# Patient Record
Sex: Male | Born: 1945 | Race: White | Hispanic: No | Marital: Married | Smoking: Former smoker
Health system: Southern US, Community
[De-identification: ages and names within clinical notes are randomized; demographics above are authoritative.]

## PROBLEM LIST (undated history)

## (undated) DIAGNOSIS — I10 Essential (primary) hypertension: Secondary | ICD-10-CM

## (undated) DIAGNOSIS — E785 Hyperlipidemia, unspecified: Secondary | ICD-10-CM

---

## 2003-02-17 ENCOUNTER — Encounter: Payer: Self-pay | Admitting: Emergency Medicine

## 2003-02-17 ENCOUNTER — Emergency Department (HOSPITAL_COMMUNITY): Admission: EM | Admit: 2003-02-17 | Discharge: 2003-02-17 | Payer: Self-pay | Admitting: Emergency Medicine

## 2014-11-08 ENCOUNTER — Other Ambulatory Visit: Payer: Self-pay | Admitting: Cardiology

## 2014-11-08 DIAGNOSIS — I714 Abdominal aortic aneurysm, without rupture, unspecified: Secondary | ICD-10-CM

## 2014-11-13 ENCOUNTER — Ambulatory Visit
Admission: RE | Admit: 2014-11-13 | Discharge: 2014-11-13 | Disposition: A | Discharge status: Home / Self Care | Payer: PRIVATE HEALTH INSURANCE | Source: Ambulatory Visit | Attending: Cardiology | Admitting: Cardiology

## 2014-11-13 ENCOUNTER — Other Ambulatory Visit: Payer: Self-pay | Admitting: Cardiology

## 2014-11-13 DIAGNOSIS — I714 Abdominal aortic aneurysm, without rupture, unspecified: Secondary | ICD-10-CM

## 2016-07-07 IMAGING — US US AORTA
1 series · 13 of 13 positions shown · non-contrast
Comparison: None.

CLINICAL DATA: Family history of abdominal aortic aneurysm.

EXAM:
ULTRASOUND OF ABDOMINAL AORTA
TECHNIQUE: Ultrasound examination of the abdominal aorta was performed to
evaluate for abdominal aortic aneurysm.

[Series 1: us aorta · 0.38mm/px · 13 of 13 slices shown]
[im 1/13]
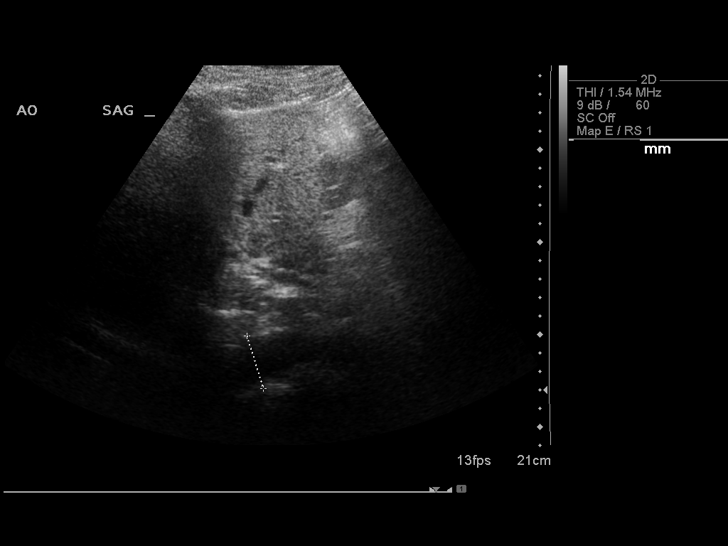
[im 2/13]
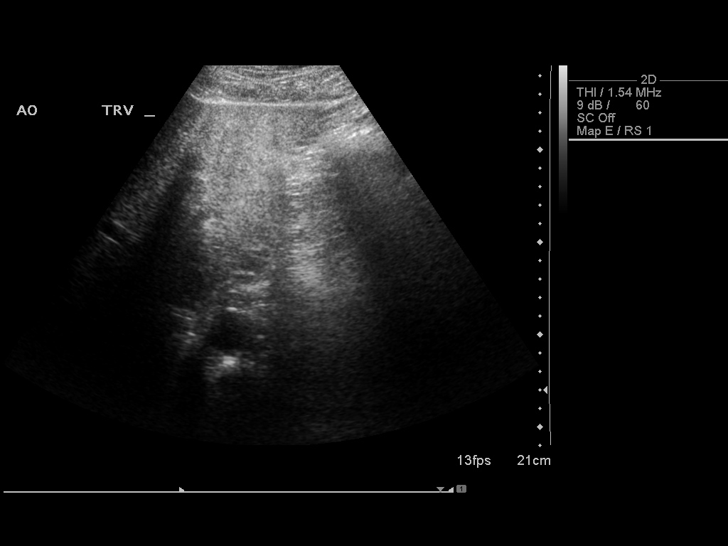
[im 3/13]
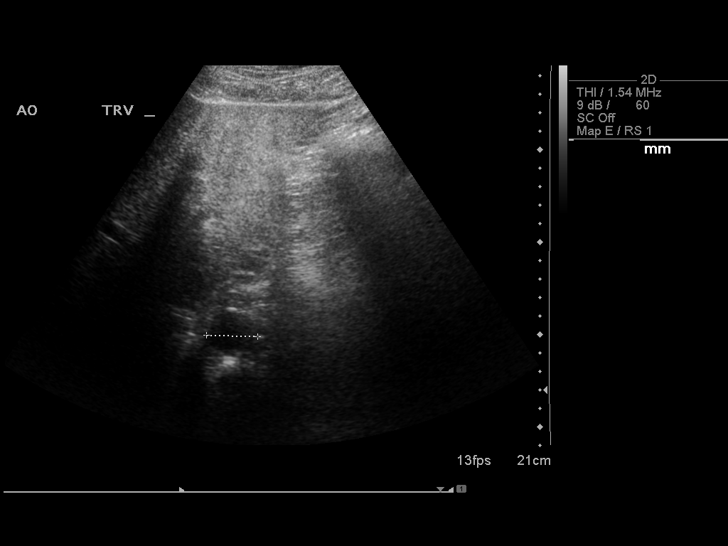
[im 4/13]
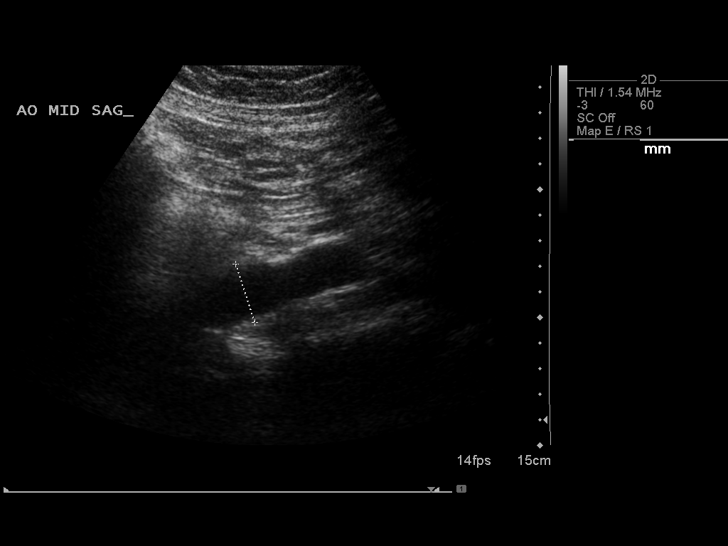
[im 5/13]
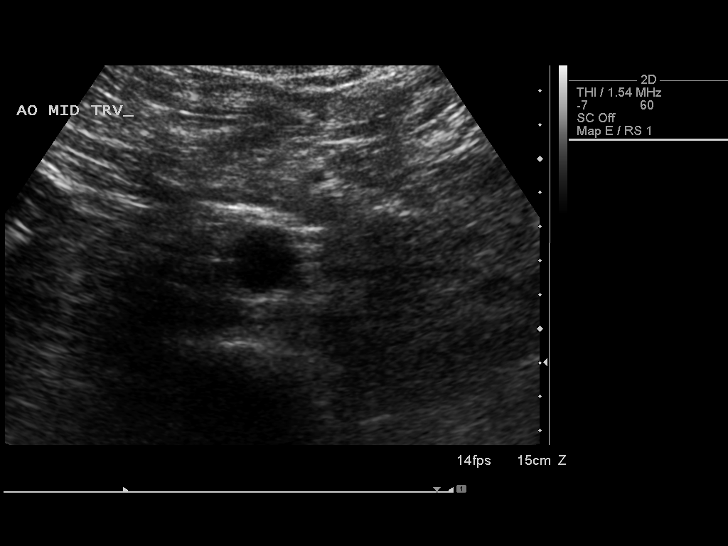
[im 6/13]
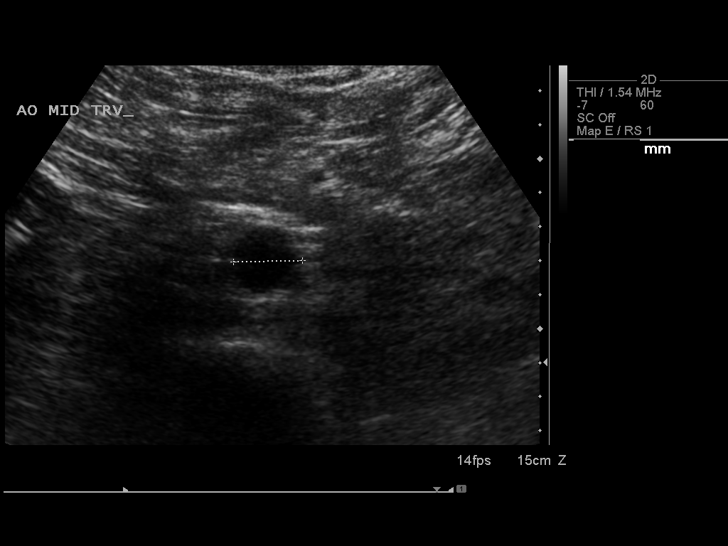
[im 7/13]
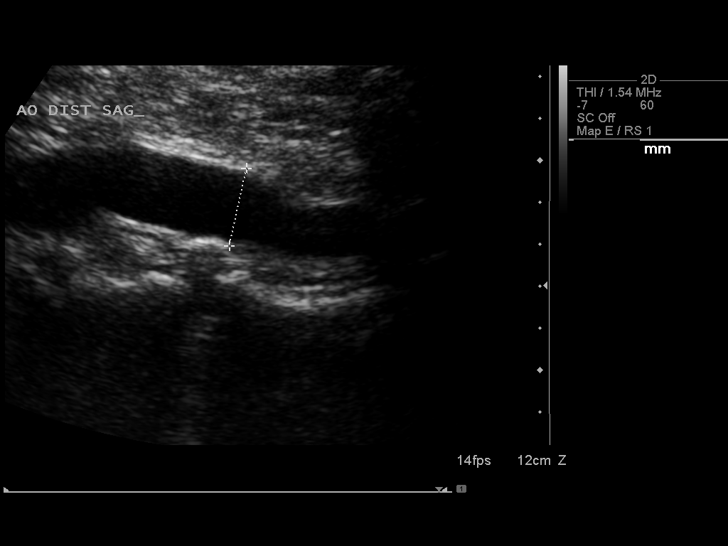
[im 8/13]
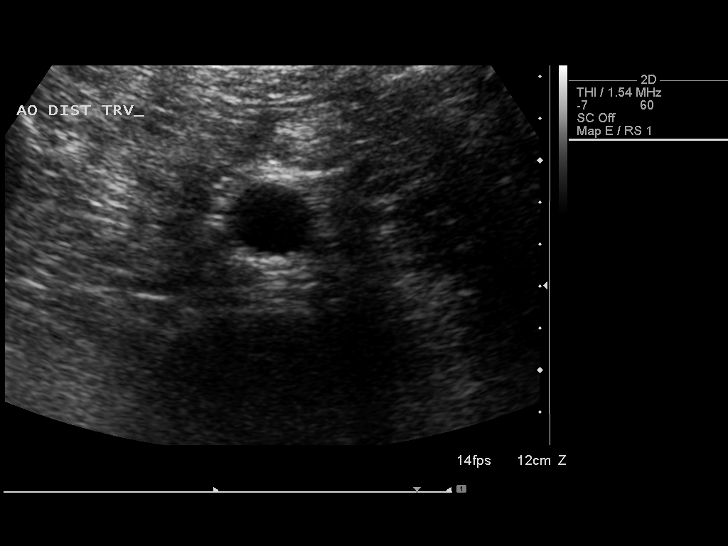
[im 9/13]
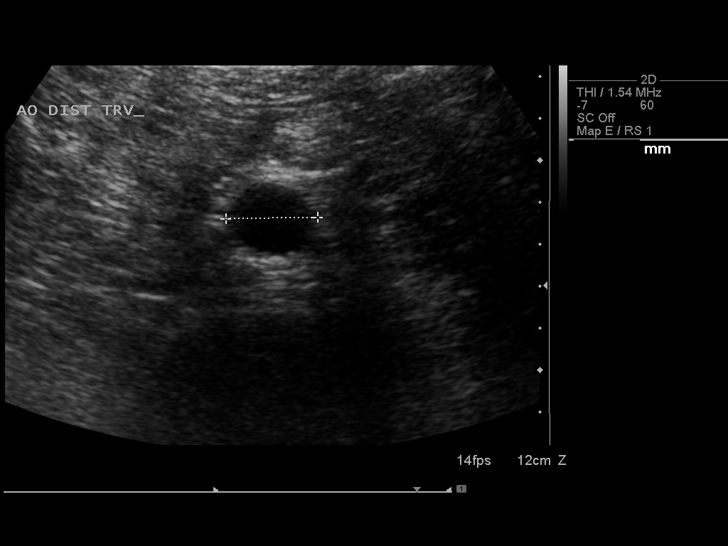
[im 10/13]
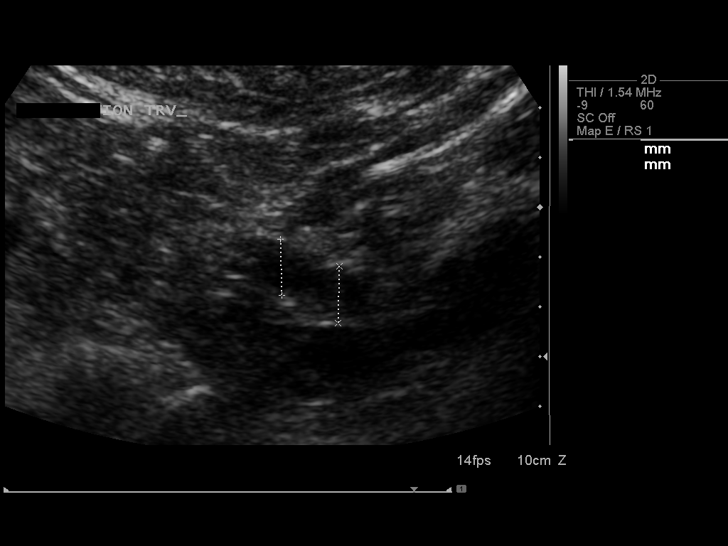
[im 11/13]
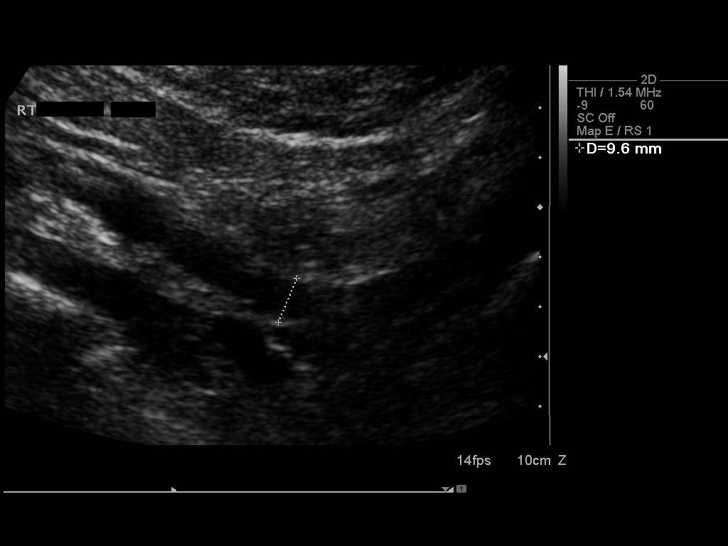
[im 12/13]
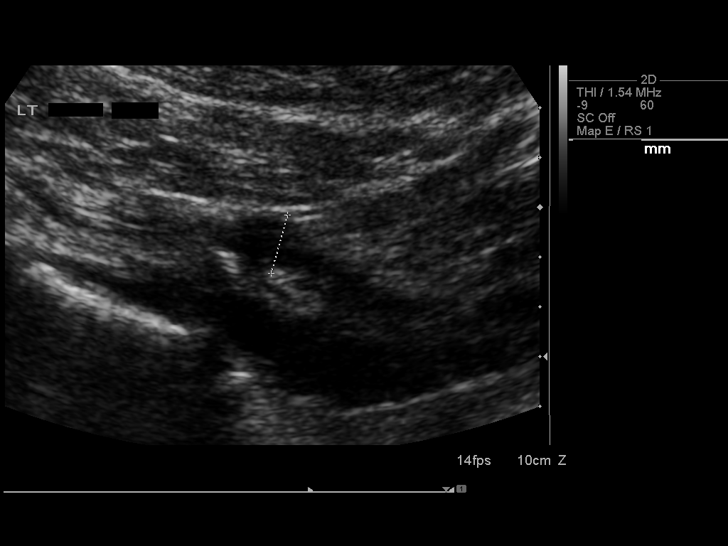
[im 13/13]
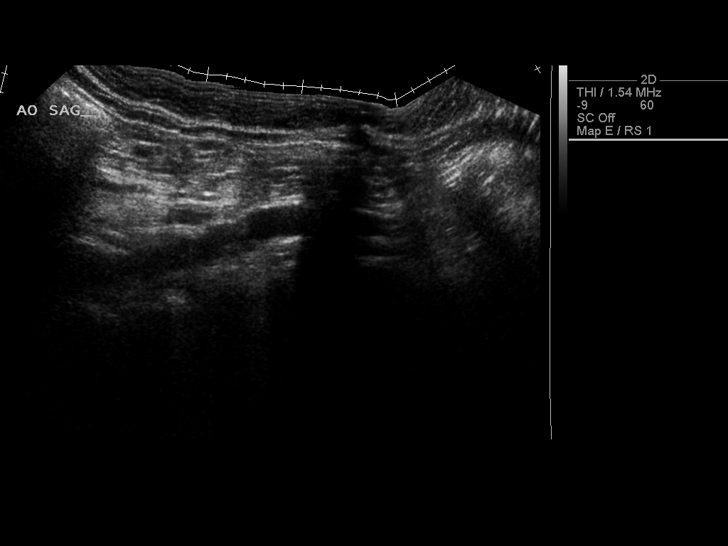

[13 of 13 positions shown; findings below may reference images not displayed]

FINDINGS: Abdominal Aorta

No aneurysm identified.

Maximum AP

Diameter:  3.0 cm

Maximum TRV

Diameter: 2.8 cm
IMPRESSION: Negative for abdominal aortic aneurysm.

## 2019-11-23 ENCOUNTER — Ambulatory Visit: Payer: Medicare Other | Attending: Internal Medicine

## 2019-11-23 DIAGNOSIS — Z23 Encounter for immunization: Secondary | ICD-10-CM

## 2019-11-23 NOTE — Progress Notes (Signed)
   Covid-19 Vaccination Clinic  Name:  [facility]    MRN: [id number] DOB: 10/11/1946  11/23/2019  [name] was observed post Covid-19 immunization for 15 minutes without incidence. He was provided with Vaccine Information Sheet and instruction to access the V-Safe system.   [name] was instructed to call 911 with any severe reactions post vaccine: Marland Kitchen Difficulty breathing  . Swelling of your face and throat  . A fast heartbeat  . A bad rash all over your body  . Dizziness and weakness    Immunizations Administered    Name Date Dose VIS Date Route   Pfizer COVID-19 Vaccine 11/23/2019 10:23 AM 0.3 mL 10/13/2019 Intramuscular   Manufacturer: [place]   Lot: [id number]   [place]: [id number]

## 2019-12-14 ENCOUNTER — Ambulatory Visit: Payer: Medicare Other | Attending: Internal Medicine

## 2019-12-14 DIAGNOSIS — Z23 Encounter for immunization: Secondary | ICD-10-CM | POA: Insufficient documentation

## 2019-12-14 NOTE — Progress Notes (Signed)
   Covid-19 Vaccination Clinic  Name:  Billy Sandoval    MRN: [id number] DOB: 08-08-1946  12/14/2019  [name] was observed post Covid-19 immunization for 15 minutes without incidence. He was provided with Vaccine Information Sheet and instruction to access the V-Safe system.   [name] was instructed to call 911 with any severe reactions post vaccine: Marland Kitchen Difficulty breathing  . Swelling of your face and throat  . A fast heartbeat  . A bad rash all over your body  . Dizziness and weakness    Immunizations Administered    Name Date Dose VIS Date Route   Pfizer COVID-19 Vaccine 12/14/2019 10:37 AM 0.3 mL 10/13/2019 Intramuscular   Manufacturer: [place]   Lot: [id number]   [place]: [id number]

## 2020-11-07 DIAGNOSIS — L570 Actinic keratosis: Secondary | ICD-10-CM | POA: Diagnosis not present

## 2020-11-07 DIAGNOSIS — L821 Other seborrheic keratosis: Secondary | ICD-10-CM | POA: Diagnosis not present

## 2020-11-07 DIAGNOSIS — L718 Other rosacea: Secondary | ICD-10-CM | POA: Diagnosis not present

## 2020-11-07 DIAGNOSIS — L218 Other seborrheic dermatitis: Secondary | ICD-10-CM | POA: Diagnosis not present

## 2020-11-07 DIAGNOSIS — L820 Inflamed seborrheic keratosis: Secondary | ICD-10-CM | POA: Diagnosis not present

## 2020-11-07 DIAGNOSIS — D485 Neoplasm of uncertain behavior of skin: Secondary | ICD-10-CM | POA: Diagnosis not present

## 2020-11-07 DIAGNOSIS — L814 Other melanin hyperpigmentation: Secondary | ICD-10-CM | POA: Diagnosis not present

## 2020-12-11 DIAGNOSIS — E782 Mixed hyperlipidemia: Secondary | ICD-10-CM | POA: Diagnosis not present

## 2020-12-11 DIAGNOSIS — N189 Chronic kidney disease, unspecified: Secondary | ICD-10-CM | POA: Diagnosis not present

## 2020-12-11 DIAGNOSIS — I10 Essential (primary) hypertension: Secondary | ICD-10-CM | POA: Diagnosis not present

## 2020-12-11 DIAGNOSIS — E785 Hyperlipidemia, unspecified: Secondary | ICD-10-CM | POA: Diagnosis not present

## 2020-12-11 DIAGNOSIS — N183 Chronic kidney disease, stage 3 unspecified: Secondary | ICD-10-CM | POA: Diagnosis not present

## 2021-05-12 DIAGNOSIS — L570 Actinic keratosis: Secondary | ICD-10-CM | POA: Diagnosis not present

## 2021-05-12 DIAGNOSIS — D225 Melanocytic nevi of trunk: Secondary | ICD-10-CM | POA: Diagnosis not present

## 2021-05-12 DIAGNOSIS — L821 Other seborrheic keratosis: Secondary | ICD-10-CM | POA: Diagnosis not present

## 2021-05-12 DIAGNOSIS — L814 Other melanin hyperpigmentation: Secondary | ICD-10-CM | POA: Diagnosis not present

## 2021-05-21 DIAGNOSIS — N189 Chronic kidney disease, unspecified: Secondary | ICD-10-CM | POA: Diagnosis not present

## 2021-05-21 DIAGNOSIS — E782 Mixed hyperlipidemia: Secondary | ICD-10-CM | POA: Diagnosis not present

## 2021-05-21 DIAGNOSIS — I10 Essential (primary) hypertension: Secondary | ICD-10-CM | POA: Diagnosis not present

## 2021-05-21 DIAGNOSIS — N183 Chronic kidney disease, stage 3 unspecified: Secondary | ICD-10-CM | POA: Diagnosis not present

## 2021-05-21 DIAGNOSIS — E785 Hyperlipidemia, unspecified: Secondary | ICD-10-CM | POA: Diagnosis not present

## 2021-07-09 DIAGNOSIS — H5212 Myopia, left eye: Secondary | ICD-10-CM | POA: Diagnosis not present

## 2021-07-09 DIAGNOSIS — H52223 Regular astigmatism, bilateral: Secondary | ICD-10-CM | POA: Diagnosis not present

## 2021-07-09 DIAGNOSIS — H2513 Age-related nuclear cataract, bilateral: Secondary | ICD-10-CM | POA: Diagnosis not present

## 2021-07-09 DIAGNOSIS — H25013 Cortical age-related cataract, bilateral: Secondary | ICD-10-CM | POA: Diagnosis not present

## 2021-07-09 DIAGNOSIS — H5201 Hypermetropia, right eye: Secondary | ICD-10-CM | POA: Diagnosis not present

## 2021-07-09 DIAGNOSIS — H524 Presbyopia: Secondary | ICD-10-CM | POA: Diagnosis not present

## 2021-07-30 DIAGNOSIS — E785 Hyperlipidemia, unspecified: Secondary | ICD-10-CM | POA: Diagnosis not present

## 2021-07-30 DIAGNOSIS — I10 Essential (primary) hypertension: Secondary | ICD-10-CM | POA: Diagnosis not present

## 2021-07-30 DIAGNOSIS — N183 Chronic kidney disease, stage 3 unspecified: Secondary | ICD-10-CM | POA: Diagnosis not present

## 2021-07-30 DIAGNOSIS — E782 Mixed hyperlipidemia: Secondary | ICD-10-CM | POA: Diagnosis not present

## 2021-09-10 DIAGNOSIS — L821 Other seborrheic keratosis: Secondary | ICD-10-CM | POA: Diagnosis not present

## 2021-09-10 DIAGNOSIS — D045 Carcinoma in situ of skin of trunk: Secondary | ICD-10-CM | POA: Diagnosis not present

## 2021-09-10 DIAGNOSIS — C44529 Squamous cell carcinoma of skin of other part of trunk: Secondary | ICD-10-CM | POA: Diagnosis not present

## 2021-09-10 DIAGNOSIS — D225 Melanocytic nevi of trunk: Secondary | ICD-10-CM | POA: Diagnosis not present

## 2021-09-10 DIAGNOSIS — L570 Actinic keratosis: Secondary | ICD-10-CM | POA: Diagnosis not present

## 2021-09-10 DIAGNOSIS — L814 Other melanin hyperpigmentation: Secondary | ICD-10-CM | POA: Diagnosis not present

## 2021-09-16 DIAGNOSIS — Z0000 Encounter for general adult medical examination without abnormal findings: Secondary | ICD-10-CM | POA: Diagnosis not present

## 2021-09-18 DIAGNOSIS — M25569 Pain in unspecified knee: Secondary | ICD-10-CM | POA: Diagnosis not present

## 2021-09-18 DIAGNOSIS — R7309 Other abnormal glucose: Secondary | ICD-10-CM | POA: Diagnosis not present

## 2021-09-18 DIAGNOSIS — R94120 Abnormal auditory function study: Secondary | ICD-10-CM | POA: Diagnosis not present

## 2021-09-18 DIAGNOSIS — R7303 Prediabetes: Secondary | ICD-10-CM | POA: Diagnosis not present

## 2021-09-18 DIAGNOSIS — H9192 Unspecified hearing loss, left ear: Secondary | ICD-10-CM | POA: Diagnosis not present

## 2021-09-18 DIAGNOSIS — Z8739 Personal history of other diseases of the musculoskeletal system and connective tissue: Secondary | ICD-10-CM | POA: Diagnosis not present

## 2021-09-18 DIAGNOSIS — E785 Hyperlipidemia, unspecified: Secondary | ICD-10-CM | POA: Diagnosis not present

## 2021-09-18 DIAGNOSIS — E782 Mixed hyperlipidemia: Secondary | ICD-10-CM | POA: Diagnosis not present

## 2021-09-18 DIAGNOSIS — N1831 Chronic kidney disease, stage 3a: Secondary | ICD-10-CM | POA: Diagnosis not present

## 2021-09-18 DIAGNOSIS — I10 Essential (primary) hypertension: Secondary | ICD-10-CM | POA: Diagnosis not present

## 2021-10-13 DIAGNOSIS — E785 Hyperlipidemia, unspecified: Secondary | ICD-10-CM | POA: Diagnosis not present

## 2021-10-13 DIAGNOSIS — I10 Essential (primary) hypertension: Secondary | ICD-10-CM | POA: Diagnosis not present

## 2021-10-13 DIAGNOSIS — N1831 Chronic kidney disease, stage 3a: Secondary | ICD-10-CM | POA: Diagnosis not present

## 2022-11-01 ENCOUNTER — Encounter (HOSPITAL_BASED_OUTPATIENT_CLINIC_OR_DEPARTMENT_OTHER): Payer: Self-pay

## 2022-11-01 ENCOUNTER — Other Ambulatory Visit: Payer: Self-pay

## 2022-11-01 ENCOUNTER — Emergency Department (HOSPITAL_BASED_OUTPATIENT_CLINIC_OR_DEPARTMENT_OTHER): Payer: Medicare Other | Admitting: Radiology

## 2022-11-01 ENCOUNTER — Inpatient Hospital Stay (HOSPITAL_BASED_OUTPATIENT_CLINIC_OR_DEPARTMENT_OTHER)
Admission: EM | Admit: 2022-11-01 | Discharge: 2022-11-03 | DRG: 030 | Disposition: A | Discharge status: Home / Self Care | Payer: Medicare Other | Attending: Neurological Surgery | Admitting: Neurological Surgery

## 2022-11-01 DIAGNOSIS — I10 Essential (primary) hypertension: Secondary | ICD-10-CM | POA: Diagnosis present

## 2022-11-01 DIAGNOSIS — G834 Cauda equina syndrome: Secondary | ICD-10-CM | POA: Diagnosis not present

## 2022-11-01 DIAGNOSIS — M48061 Spinal stenosis, lumbar region without neurogenic claudication: Secondary | ICD-10-CM | POA: Diagnosis present

## 2022-11-01 DIAGNOSIS — M4726 Other spondylosis with radiculopathy, lumbar region: Secondary | ICD-10-CM | POA: Diagnosis present

## 2022-11-01 DIAGNOSIS — M5442 Lumbago with sciatica, left side: Principal | ICD-10-CM

## 2022-11-01 DIAGNOSIS — R29898 Other symptoms and signs involving the musculoskeletal system: Secondary | ICD-10-CM

## 2022-11-01 DIAGNOSIS — M5116 Intervertebral disc disorders with radiculopathy, lumbar region: Secondary | ICD-10-CM | POA: Diagnosis present

## 2022-11-01 DIAGNOSIS — M5126 Other intervertebral disc displacement, lumbar region: Secondary | ICD-10-CM | POA: Diagnosis present

## 2022-11-01 DIAGNOSIS — M549 Dorsalgia, unspecified: Secondary | ICD-10-CM | POA: Diagnosis not present

## 2022-11-01 DIAGNOSIS — Z9889 Other specified postprocedural states: Secondary | ICD-10-CM

## 2022-11-01 DIAGNOSIS — R339 Retention of urine, unspecified: Secondary | ICD-10-CM | POA: Diagnosis not present

## 2022-11-01 DIAGNOSIS — Z87891 Personal history of nicotine dependence: Secondary | ICD-10-CM

## 2022-11-01 HISTORY — DX: Essential (primary) hypertension: I10

## 2022-11-01 HISTORY — DX: Hyperlipidemia, unspecified: E78.5

## 2022-11-01 LAB — URINALYSIS, ROUTINE W REFLEX MICROSCOPIC
Bilirubin Urine: NEGATIVE
Glucose, UA: NEGATIVE mg/dL
Hgb urine dipstick: NEGATIVE
Ketones, ur: NEGATIVE mg/dL
Leukocytes,Ua: NEGATIVE
Nitrite: NEGATIVE
Protein, ur: NEGATIVE mg/dL
Specific Gravity, Urine: 1.025 (ref 1.005–1.030)
pH: 6.5 (ref 5.0–8.0)

## 2022-11-01 LAB — CBC WITH DIFFERENTIAL/PLATELET
Abs Immature Granulocytes: 0.02 10*3/uL (ref 0.00–0.07)
Basophils Absolute: 0 10*3/uL (ref 0.0–0.1)
Basophils Relative: 0 %
Eosinophils Absolute: 0 10*3/uL (ref 0.0–0.5)
Eosinophils Relative: 0 %
HCT: 41.1 % (ref 39.0–52.0)
Hemoglobin: 14.6 g/dL (ref 13.0–17.0)
Immature Granulocytes: 0 %
Lymphocytes Relative: 9 %
Lymphs Abs: 0.5 10*3/uL — ABNORMAL LOW (ref 0.7–4.0)
MCH: 31.7 pg (ref 26.0–34.0)
MCHC: 35.5 g/dL (ref 30.0–36.0)
MCV: 89.3 fL (ref 80.0–100.0)
Monocytes Absolute: 0.1 10*3/uL (ref 0.1–1.0)
Monocytes Relative: 1 %
Neutro Abs: 4.6 10*3/uL (ref 1.7–7.7)
Neutrophils Relative %: 90 %
Platelets: 189 10*3/uL (ref 150–400)
RBC: 4.6 MIL/uL (ref 4.22–5.81)
RDW: 12.6 % (ref 11.5–15.5)
WBC: 5.2 10*3/uL (ref 4.0–10.5)
nRBC: 0 % (ref 0.0–0.2)

## 2022-11-01 MED ORDER — METHOCARBAMOL 500 MG PO TABS
750.0000 mg | ORAL_TABLET | Freq: Once | ORAL | Status: AC
  Administered 2022-11-01: 750 mg via ORAL
  Filled 2022-11-01: qty 2

## 2022-11-01 MED ORDER — HYDROMORPHONE HCL 1 MG/ML IJ SOLN
1.0000 mg | Freq: Once | INTRAMUSCULAR | Status: AC
  Administered 2022-11-01: 1 mg via INTRAVENOUS
  Filled 2022-11-01: qty 1

## 2022-11-01 MED ORDER — SODIUM CHLORIDE 0.9 % IV SOLN: Freq: Once | INTRAVENOUS | Status: AC

## 2022-11-01 MED ORDER — HYDROMORPHONE HCL 1 MG/ML IJ SOLN
1.0000 mg | Freq: Once | INTRAMUSCULAR | Status: AC
  Administered 2022-11-01: 1 mg via INTRAMUSCULAR
  Filled 2022-11-01: qty 1

## 2022-11-01 MED ORDER — DEXAMETHASONE SODIUM PHOSPHATE 10 MG/ML IJ SOLN
10.0000 mg | Freq: Once | INTRAMUSCULAR | Status: AC
  Administered 2022-11-01: 10 mg via INTRAMUSCULAR
  Filled 2022-11-01: qty 1

## 2022-11-01 NOTE — ED Provider Notes (Signed)
[facility] Provider Note   CSN: [id number] Arrival date & time: 11/01/22  1336     History  Chief Complaint  Patient presents with   Back Pain    Daily Crate is a 76 y.o. male.  HPI 3 days ago patient lifted several 100 pounds.  He reports he felt a strain and a pop in his back.  At first it was not too painful.  Initially he felt like he had discomfort in his low back radiating into the right buttock and down the leg somewhat.  He reports it was tolerable.  It then started to migrate into the left as well.  He reports as of yesterday he has been fairly immobilized during the pain.  He started using a walker and the pain migrated into the left buttock and leg so severe that he has to pick it up to move it.  At baseline he is very active and does a lot of chores and does not use a walker.  No abdominal pain.  No syncope.  No prior history of significant back problems.    Home Medications Prior to Admission medications   Not on File      Allergies    Patient has no known allergies.    Review of Systems   Review of Systems  Physical Exam Updated Vital Signs BP (!) 178/83 (BP Location: Left Arm)   Pulse 75   Temp 98 F (36.7 C) (Oral)   Resp 14   Ht '5\' 9"'$  (1.753 m)   Wt 83.9 kg   SpO2 97%   BMI 27.32 kg/m  Physical Exam Constitutional:      Comments: Alert and nontoxic.  Clinically well in appearance.  HENT:     Head: Normocephalic and atraumatic.  Eyes:     Extraocular Movements: Extraocular movements intact.  Cardiovascular:     Rate and Rhythm: Normal rate and regular rhythm.  Pulmonary:     Effort: Pulmonary effort is normal.     Breath sounds: Normal breath sounds.  Abdominal:     General: There is no distension.     Palpations: Abdomen is soft.     Tenderness: There is no abdominal tenderness. There is no guarding.  Musculoskeletal:     Comments: Reproducible pain into the left lower back and left buttock.  Severe pain  with leg lift on the left.  With transitioning from wheelchair to stretcher severe pain to the left lower back.  Patient is able to transfer with assist.  He can hold his right leg up against resistance.  Left leg when elevated can be held briefly.  Intact strength for dorsiflexion.  Skin:    General: Skin is warm and dry.  Neurological:     Comments: Patient is alert and oriented.  Normal cognitive function.  Normal upper extremity strength.  Using both upper extremities well to help with mobility and movement.  Left lower extremity 3\5 strength testing in straight leg raise and holding against resistance.  Psychiatric:        Mood and Affect: Mood normal.     ED Results / Procedures / Treatments   Labs (all labs ordered are listed, but only abnormal results are displayed) Labs Reviewed  URINALYSIS, ROUTINE W REFLEX MICROSCOPIC  BASIC METABOLIC PANEL  CBC WITH DIFFERENTIAL/PLATELET    EKG None  Radiology DG Lumbar Spine Complete  Result Date: 11/01/2022 CLINICAL DATA:  Progressive back pain over the last 2 days. Symptoms are worse right  than left. EXAM: LUMBAR SPINE - COMPLETE 4 VIEW COMPARISON:  None Available. FINDINGS: Five non rib-bearing lumbar type vertebral bodies are present. No significant listhesis is present. Straightening of the normal cervical lordosis is present. Vertebral body heights are maintained. Moderate rightward curvature is centered at L2. Chronic asymmetric left-sided degenerative changes are present at L1-2 and L2-3. Right lateral listhesis of L3 on L4 measures up to 9 mm. Asymmetric right sided endplate and probable foraminal changes are present at L4-5 and L5-S1. Atherosclerotic changes are present in the aorta without evidence for aneurysm. IMPRESSION: 1. No acute abnormality. 2. Moderate rightward curvature centered at L2. 3. Asymmetric right-sided endplate changes and probable foraminal narrowing at L4-5 and L5-S1. 4. Right lateral listhesis of L3 on L4.  Electronically Signed   By: San Morelle M.D.   On: 11/01/2022 14:41    Procedures Procedures    Medications Ordered in ED Medications  0.9 %  sodium chloride infusion (has no administration in time range)  HYDROmorphone (DILAUDID) injection 1 mg (has no administration in time range)  dexamethasone (DECADRON) injection 10 mg (10 mg Intramuscular Given 11/01/22 1953)  HYDROmorphone (DILAUDID) injection 1 mg (1 mg Intramuscular Given 11/01/22 1953)  methocarbamol (ROBAXIN) tablet 750 mg (750 mg Oral Given 11/01/22 1953)    ED Course/ Medical Decision Making/ A&P                           Medical Decision Making Amount and/or Complexity of Data Reviewed Labs: ordered. Radiology: ordered.  Risk Prescription drug management.   Patient presents with heavy lift that resulted in lower back pain.  Initially he had radiculopathy into the right but then migrated to the left.  When the pain migrated into the left buttock and left leg, it was much worse on the right side.  Since then, patient has had problems with being able to get comfortable and difficulty transitioning from sitting to standing and ambulating.  On exam, patient does have severe pain with elevation or movement of the left leg.  Plan to treat with Decadron, Robaxin and Dilaudid and reassess for strength testing versus pain limitations.  Patient reassessed approximately an hour after medications.  He is comfortable at rest supine in the stretcher, strength testing for the right lower extremity is intact he can hold against resistance off of the stretcher.  Left lower extremity he can hold briefly but is also having severe pain.  He cannot hold against resistance.  We did transition to a sitting and standing position.  He could get to a standing position but needed the walker to ambulate.  He could put weight on the left however weaker to ambulate with the left leg.  At this point, patient has severe pain with any activities  that is limiting for home.  Will administer another milligram of Dilaudid IV.  Will plan for MRI to assess degree of nerve impingement.  Patient's mechanism of injury is very consistent with a disc herniation.  If MRI does not show any critical impingement and patient is pain controlled after Decadron and several doses of narcotic pain medication, may consider discharge to home on steroid taper and narcotic pain control.  I have reviewed this with the patient.  He is agreeable with this plan.  His wife has been at bedside for all planning and treatment phase.  Dr. Oswald Hillock at [facility] emergency department accepts for ED to ED transfer for MRI.  Final Clinical Impression(s) / ED Diagnoses Final diagnoses:  Acute left-sided low back pain with left-sided sciatica  Left leg weakness    Rx / DC Orders ED Discharge Orders     None         Charlesetta Shanks, MD 11/01/22 2251

## 2022-11-01 NOTE — ED Triage Notes (Signed)
Patient here POV from Home.  Endorses Back Pain possibly related to Sciatica that has been worsened over the past two days. Worse on Right than Left. Endorses No Known Injury but states it may be due to Lifting a heavy Item. No Dysuria.   NAD Noted during Triage. A&Ox4. GCS 15. Ambulatory.

## 2022-11-02 ENCOUNTER — Inpatient Hospital Stay (HOSPITAL_COMMUNITY): Payer: Medicare Other | Admitting: Anesthesiology

## 2022-11-02 ENCOUNTER — Emergency Department (HOSPITAL_COMMUNITY): Payer: Medicare Other

## 2022-11-02 ENCOUNTER — Other Ambulatory Visit: Payer: Self-pay

## 2022-11-02 ENCOUNTER — Inpatient Hospital Stay (HOSPITAL_COMMUNITY)
Admission: EM | Disposition: A | Discharge status: Home / Self Care | Payer: Self-pay | Source: Home / Self Care | Attending: Neurological Surgery

## 2022-11-02 ENCOUNTER — Inpatient Hospital Stay (HOSPITAL_COMMUNITY): Payer: Medicare Other

## 2022-11-02 ENCOUNTER — Encounter (HOSPITAL_COMMUNITY): Payer: Self-pay | Admitting: Neurosurgery

## 2022-11-02 DIAGNOSIS — M5416 Radiculopathy, lumbar region: Secondary | ICD-10-CM | POA: Diagnosis not present

## 2022-11-02 DIAGNOSIS — I10 Essential (primary) hypertension: Secondary | ICD-10-CM | POA: Diagnosis present

## 2022-11-02 DIAGNOSIS — M5126 Other intervertebral disc displacement, lumbar region: Secondary | ICD-10-CM | POA: Diagnosis present

## 2022-11-02 DIAGNOSIS — Z9889 Other specified postprocedural states: Secondary | ICD-10-CM

## 2022-11-02 DIAGNOSIS — M48061 Spinal stenosis, lumbar region without neurogenic claudication: Secondary | ICD-10-CM

## 2022-11-02 DIAGNOSIS — G834 Cauda equina syndrome: Secondary | ICD-10-CM | POA: Diagnosis present

## 2022-11-02 DIAGNOSIS — M5116 Intervertebral disc disorders with radiculopathy, lumbar region: Secondary | ICD-10-CM | POA: Diagnosis present

## 2022-11-02 DIAGNOSIS — M549 Dorsalgia, unspecified: Secondary | ICD-10-CM | POA: Diagnosis present

## 2022-11-02 DIAGNOSIS — Z87891 Personal history of nicotine dependence: Secondary | ICD-10-CM | POA: Diagnosis not present

## 2022-11-02 DIAGNOSIS — R339 Retention of urine, unspecified: Secondary | ICD-10-CM | POA: Diagnosis not present

## 2022-11-02 DIAGNOSIS — M4726 Other spondylosis with radiculopathy, lumbar region: Secondary | ICD-10-CM | POA: Diagnosis present

## 2022-11-02 HISTORY — PX: LUMBAR LAMINECTOMY/DECOMPRESSION MICRODISCECTOMY: SHX5026

## 2022-11-02 LAB — BASIC METABOLIC PANEL
Anion gap: 12 (ref 5–15)
Anion gap: 6 (ref 5–15)
BUN: 12 mg/dL (ref 8–23)
BUN: 14 mg/dL (ref 8–23)
CO2: 18 mmol/L — ABNORMAL LOW (ref 22–32)
CO2: 23 mmol/L (ref 22–32)
Calcium: 5.7 mg/dL — CL (ref 8.9–10.3)
Calcium: 8.8 mg/dL — ABNORMAL LOW (ref 8.9–10.3)
Chloride: 101 mmol/L (ref 98–111)
Chloride: 118 mmol/L — ABNORMAL HIGH (ref 98–111)
Creatinine, Ser: 0.53 mg/dL — ABNORMAL LOW (ref 0.61–1.24)
Creatinine, Ser: 1 mg/dL (ref 0.61–1.24)
GFR, Estimated: >60 mL/min (ref >60)
GFR, Estimated: >60 mL/min (ref >60)
Glucose, Bld: 160 mg/dL — ABNORMAL HIGH (ref 70–99)
Glucose, Bld: 96 mg/dL (ref 70–99)
Potassium: 2.6 mmol/L — CL (ref 3.5–5.1)
Potassium: 4.2 mmol/L (ref 3.5–5.1)
Sodium: 136 mmol/L (ref 135–145)
Sodium: 142 mmol/L (ref 135–145)

## 2022-11-02 LAB — MAGNESIUM: Magnesium: 1.3 mg/dL — ABNORMAL LOW (ref 1.7–2.4)

## 2022-11-02 LAB — PHOSPHORUS: Phosphorus: 1.3 mg/dL — ABNORMAL LOW (ref 2.5–4.6)

## 2022-11-02 SURGERY — LUMBAR LAMINECTOMY/DECOMPRESSION MICRODISCECTOMY 1 LEVEL
Anesthesia: General | Site: Back | Laterality: Bilateral

## 2022-11-02 MED ORDER — ONDANSETRON HCL 4 MG/2ML IJ SOLN: 4.0000 mg | Freq: Four times a day (QID) | INTRAMUSCULAR | Status: DC | PRN

## 2022-11-02 MED ORDER — HYDROCODONE-ACETAMINOPHEN 7.5-325 MG PO TABS
1.0000 | ORAL_TABLET | Freq: Four times a day (QID) | ORAL | Status: DC
  Administered 2022-11-03 (×3): 1 via ORAL
  Filled 2022-11-02 (×3): qty 1

## 2022-11-02 MED ORDER — OXYCODONE HCL 5 MG PO TABS: 5.0000 mg | ORAL_TABLET | Freq: Once | ORAL | Status: DC | PRN

## 2022-11-02 MED ORDER — ORAL CARE MOUTH RINSE: 15.0000 mL | Freq: Once | OROMUCOSAL | Status: AC

## 2022-11-02 MED ORDER — ROCURONIUM BROMIDE 10 MG/ML (PF) SYRINGE
PREFILLED_SYRINGE | INTRAVENOUS | Status: AC
  Filled 2022-11-02: qty 10

## 2022-11-02 MED ORDER — DEXAMETHASONE 4 MG PO TABS
4.0000 mg | ORAL_TABLET | Freq: Four times a day (QID) | ORAL | Status: DC
  Administered 2022-11-02 – 2022-11-03 (×4): 4 mg via ORAL
  Filled 2022-11-02 (×4): qty 1

## 2022-11-02 MED ORDER — DEXAMETHASONE SODIUM PHOSPHATE 4 MG/ML IJ SOLN
4.0000 mg | Freq: Four times a day (QID) | INTRAMUSCULAR | Status: DC
  Administered 2022-11-02: 4 mg via INTRAVENOUS
  Filled 2022-11-02: qty 1

## 2022-11-02 MED ORDER — SUGAMMADEX SODIUM 200 MG/2ML IV SOLN
INTRAVENOUS | Status: DC | PRN
  Administered 2022-11-02: 100 mg via INTRAVENOUS
  Administered 2022-11-02: 200 mg via INTRAVENOUS

## 2022-11-02 MED ORDER — CEFAZOLIN SODIUM-DEXTROSE 2-4 GM/100ML-% IV SOLN
2.0000 g | Freq: Three times a day (TID) | INTRAVENOUS | Status: AC
  Administered 2022-11-02 – 2022-11-03 (×2): 2 g via INTRAVENOUS
  Filled 2022-11-02 (×2): qty 100

## 2022-11-02 MED ORDER — ONDANSETRON HCL 4 MG PO TABS: 4.0000 mg | ORAL_TABLET | Freq: Four times a day (QID) | ORAL | Status: DC | PRN

## 2022-11-02 MED ORDER — PHENYLEPHRINE HCL (PRESSORS) 10 MG/ML IV SOLN
INTRAVENOUS | Status: AC
  Filled 2022-11-02: qty 1

## 2022-11-02 MED ORDER — THROMBIN 5000 UNITS EX SOLR: OROMUCOSAL | Status: DC | PRN

## 2022-11-02 MED ORDER — FENTANYL CITRATE (PF) 250 MCG/5ML IJ SOLN
INTRAMUSCULAR | Status: DC | PRN
  Administered 2022-11-02: 100 ug via INTRAVENOUS

## 2022-11-02 MED ORDER — FENTANYL CITRATE (PF) 100 MCG/2ML IJ SOLN: 25.0000 ug | INTRAMUSCULAR | Status: DC | PRN

## 2022-11-02 MED ORDER — BUPIVACAINE HCL (PF) 0.25 % IJ SOLN
INTRAMUSCULAR | Status: AC
  Filled 2022-11-02: qty 30

## 2022-11-02 MED ORDER — MENTHOL 3 MG MT LOZG: 1.0000 | LOZENGE | OROMUCOSAL | Status: DC | PRN

## 2022-11-02 MED ORDER — CHLORHEXIDINE GLUCONATE 0.12 % MT SOLN
OROMUCOSAL | Status: AC
  Filled 2022-11-02: qty 15

## 2022-11-02 MED ORDER — POTASSIUM CHLORIDE IN NACL 20-0.9 MEQ/L-% IV SOLN
INTRAVENOUS | Status: DC
  Filled 2022-11-02: qty 1000

## 2022-11-02 MED ORDER — ACETAMINOPHEN 160 MG/5ML PO SOLN: 1000.0000 mg | Freq: Once | ORAL | Status: DC | PRN

## 2022-11-02 MED ORDER — EPHEDRINE 5 MG/ML INJ
INTRAVENOUS | Status: AC
  Filled 2022-11-02: qty 5

## 2022-11-02 MED ORDER — PHENOL 1.4 % MT LIQD: 1.0000 | OROMUCOSAL | Status: DC | PRN

## 2022-11-02 MED ORDER — ACETAMINOPHEN 325 MG PO TABS: 650.0000 mg | ORAL_TABLET | Freq: Four times a day (QID) | ORAL | Status: DC | PRN

## 2022-11-02 MED ORDER — ROCURONIUM BROMIDE 10 MG/ML (PF) SYRINGE
PREFILLED_SYRINGE | INTRAVENOUS | Status: DC | PRN
  Administered 2022-11-02: 30 mg via INTRAVENOUS
  Administered 2022-11-02: 50 mg via INTRAVENOUS

## 2022-11-02 MED ORDER — THROMBIN 5000 UNITS EX SOLR
CUTANEOUS | Status: AC
  Filled 2022-11-02: qty 10000

## 2022-11-02 MED ORDER — HYDROMORPHONE HCL 1 MG/ML IJ SOLN: 0.5000 mg | INTRAMUSCULAR | Status: DC | PRN

## 2022-11-02 MED ORDER — METHOCARBAMOL 1000 MG/10ML IJ SOLN: 500.0000 mg | Freq: Four times a day (QID) | INTRAVENOUS | Status: DC | PRN

## 2022-11-02 MED ORDER — CHLORHEXIDINE GLUCONATE 0.12 % MT SOLN
15.0000 mL | Freq: Once | OROMUCOSAL | Status: AC
  Administered 2022-11-02: 15 mL via OROMUCOSAL

## 2022-11-02 MED ORDER — MAGNESIUM OXIDE -MG SUPPLEMENT 400 (240 MG) MG PO TABS
800.0000 mg | ORAL_TABLET | Freq: Once | ORAL | Status: AC
  Administered 2022-11-02: 800 mg via ORAL
  Filled 2022-11-02: qty 2

## 2022-11-02 MED ORDER — SODIUM CHLORIDE 0.9% FLUSH: 3.0000 mL | INTRAVENOUS | Status: DC | PRN

## 2022-11-02 MED ORDER — HYDROCODONE-ACETAMINOPHEN 5-325 MG PO TABS: 1.0000 | ORAL_TABLET | ORAL | Status: DC | PRN

## 2022-11-02 MED ORDER — LIDOCAINE 2% (20 MG/ML) 5 ML SYRINGE
INTRAMUSCULAR | Status: AC
  Filled 2022-11-02: qty 5

## 2022-11-02 MED ORDER — CELECOXIB 200 MG PO CAPS
200.0000 mg | ORAL_CAPSULE | Freq: Two times a day (BID) | ORAL | Status: DC
  Administered 2022-11-02 – 2022-11-03 (×2): 200 mg via ORAL
  Filled 2022-11-02 (×2): qty 1

## 2022-11-02 MED ORDER — POTASSIUM CHLORIDE CRYS ER 20 MEQ PO TBCR
40.0000 meq | EXTENDED_RELEASE_TABLET | Freq: Once | ORAL | Status: AC
  Administered 2022-11-02: 40 meq via ORAL
  Filled 2022-11-02: qty 2

## 2022-11-02 MED ORDER — 0.9 % SODIUM CHLORIDE (POUR BTL) OPTIME
TOPICAL | Status: DC | PRN
  Administered 2022-11-02: 1000 mL

## 2022-11-02 MED ORDER — DOCUSATE SODIUM 100 MG PO CAPS: 100.0000 mg | ORAL_CAPSULE | Freq: Two times a day (BID) | ORAL | Status: DC

## 2022-11-02 MED ORDER — FENTANYL CITRATE (PF) 250 MCG/5ML IJ SOLN
INTRAMUSCULAR | Status: AC
  Filled 2022-11-02: qty 5

## 2022-11-02 MED ORDER — CEFAZOLIN SODIUM-DEXTROSE 2-3 GM-%(50ML) IV SOLR
INTRAVENOUS | Status: DC | PRN
  Administered 2022-11-02: 2 g via INTRAVENOUS

## 2022-11-02 MED ORDER — THROMBIN (RECOMBINANT) 5000 UNITS EX SOLR: CUTANEOUS | Status: DC | PRN

## 2022-11-02 MED ORDER — SODIUM CHLORIDE 0.9 % IV SOLN: 250.0000 mL | INTRAVENOUS | Status: DC

## 2022-11-02 MED ORDER — ACETAMINOPHEN 650 MG RE SUPP: 650.0000 mg | Freq: Four times a day (QID) | RECTAL | Status: DC | PRN

## 2022-11-02 MED ORDER — DEXAMETHASONE SODIUM PHOSPHATE 4 MG/ML IJ SOLN: 4.0000 mg | Freq: Four times a day (QID) | INTRAMUSCULAR | Status: DC

## 2022-11-02 MED ORDER — ONDANSETRON HCL 4 MG/2ML IJ SOLN
INTRAMUSCULAR | Status: AC
  Filled 2022-11-02: qty 2

## 2022-11-02 MED ORDER — BUPIVACAINE HCL (PF) 0.25 % IJ SOLN
INTRAMUSCULAR | Status: DC | PRN
  Administered 2022-11-02: 4 mL
  Administered 2022-11-02: 10 mL

## 2022-11-02 MED ORDER — PROPOFOL 1000 MG/100ML IV EMUL
INTRAVENOUS | Status: AC
  Filled 2022-11-02: qty 100

## 2022-11-02 MED ORDER — PROPOFOL 10 MG/ML IV BOLUS
INTRAVENOUS | Status: DC | PRN
  Administered 2022-11-02: 160 mg via INTRAVENOUS

## 2022-11-02 MED ORDER — SENNA 8.6 MG PO TABS
1.0000 | ORAL_TABLET | Freq: Two times a day (BID) | ORAL | Status: DC
  Administered 2022-11-02 – 2022-11-03 (×2): 8.6 mg via ORAL
  Filled 2022-11-02 (×2): qty 1

## 2022-11-02 MED ORDER — HYDROMORPHONE HCL 1 MG/ML IJ SOLN: 1.0000 mg | INTRAMUSCULAR | Status: DC | PRN

## 2022-11-02 MED ORDER — PHENYLEPHRINE 80 MCG/ML (10ML) SYRINGE FOR IV PUSH (FOR BLOOD PRESSURE SUPPORT)
PREFILLED_SYRINGE | INTRAVENOUS | Status: DC | PRN
  Administered 2022-11-02: 40 ug via INTRAVENOUS
  Administered 2022-11-02 (×3): 80 ug via INTRAVENOUS

## 2022-11-02 MED ORDER — ACETAMINOPHEN 500 MG PO TABS: 1000.0000 mg | ORAL_TABLET | Freq: Once | ORAL | Status: DC | PRN

## 2022-11-02 MED ORDER — CEFAZOLIN SODIUM 1 G IJ SOLR
INTRAMUSCULAR | Status: AC
  Filled 2022-11-02: qty 20

## 2022-11-02 MED ORDER — ACETAMINOPHEN 325 MG PO TABS: 650.0000 mg | ORAL_TABLET | ORAL | Status: DC | PRN

## 2022-11-02 MED ORDER — LACTATED RINGERS IV SOLN: INTRAVENOUS | Status: DC

## 2022-11-02 MED ORDER — PHENYLEPHRINE HCL-NACL 20-0.9 MG/250ML-% IV SOLN
INTRAVENOUS | Status: DC | PRN
  Administered 2022-11-02: 25 ug/min via INTRAVENOUS

## 2022-11-02 MED ORDER — METHOCARBAMOL 500 MG PO TABS
500.0000 mg | ORAL_TABLET | Freq: Four times a day (QID) | ORAL | Status: DC | PRN
  Administered 2022-11-02: 500 mg via ORAL
  Filled 2022-11-02: qty 1

## 2022-11-02 MED ORDER — SENNA 8.6 MG PO TABS: 1.0000 | ORAL_TABLET | Freq: Two times a day (BID) | ORAL | Status: DC

## 2022-11-02 MED ORDER — ONDANSETRON HCL 4 MG/2ML IJ SOLN
INTRAMUSCULAR | Status: DC | PRN
  Administered 2022-11-02: 4 mg via INTRAVENOUS

## 2022-11-02 MED ORDER — DEXAMETHASONE SODIUM PHOSPHATE 10 MG/ML IJ SOLN
INTRAMUSCULAR | Status: AC
  Filled 2022-11-02: qty 1

## 2022-11-02 MED ORDER — ACETAMINOPHEN 10 MG/ML IV SOLN: 1000.0000 mg | Freq: Once | INTRAVENOUS | Status: DC | PRN

## 2022-11-02 MED ORDER — LIDOCAINE 2% (20 MG/ML) 5 ML SYRINGE
INTRAMUSCULAR | Status: DC | PRN
  Administered 2022-11-02: 60 mg via INTRAVENOUS

## 2022-11-02 MED ORDER — POTASSIUM CHLORIDE 10 MEQ/100ML IV SOLN
10.0000 meq | INTRAVENOUS | Status: DC
  Administered 2022-11-02 (×2): 10 meq via INTRAVENOUS
  Filled 2022-11-02: qty 100

## 2022-11-02 MED ORDER — TAMSULOSIN HCL 0.4 MG PO CAPS
0.4000 mg | ORAL_CAPSULE | Freq: Every day | ORAL | Status: DC
  Administered 2022-11-02 – 2022-11-03 (×2): 0.4 mg via ORAL
  Filled 2022-11-02 (×2): qty 1

## 2022-11-02 MED ORDER — OXYCODONE HCL 5 MG/5ML PO SOLN: 5.0000 mg | Freq: Once | ORAL | Status: DC | PRN

## 2022-11-02 MED ORDER — THROMBIN 5000 UNITS EX SOLR
CUTANEOUS | Status: AC
  Filled 2022-11-02: qty 5000

## 2022-11-02 MED ORDER — EPHEDRINE SULFATE-NACL 50-0.9 MG/10ML-% IV SOSY
PREFILLED_SYRINGE | INTRAVENOUS | Status: DC | PRN
  Administered 2022-11-02 (×2): 5 mg via INTRAVENOUS

## 2022-11-02 MED ORDER — ACETAMINOPHEN 650 MG RE SUPP: 650.0000 mg | RECTAL | Status: DC | PRN

## 2022-11-02 MED ORDER — PHENYLEPHRINE 80 MCG/ML (10ML) SYRINGE FOR IV PUSH (FOR BLOOD PRESSURE SUPPORT)
PREFILLED_SYRINGE | INTRAVENOUS | Status: AC
  Filled 2022-11-02: qty 10

## 2022-11-02 MED ORDER — MORPHINE SULFATE (PF) 2 MG/ML IV SOLN: 2.0000 mg | INTRAVENOUS | Status: DC | PRN

## 2022-11-02 MED ORDER — SODIUM CHLORIDE 0.9% FLUSH
3.0000 mL | Freq: Two times a day (BID) | INTRAVENOUS | Status: DC
  Administered 2022-11-03: 3 mL via INTRAVENOUS

## 2022-11-02 SURGICAL SUPPLY — 47 items
ADH SKN CLS APL DERMABOND .7 (GAUZE/BANDAGES/DRESSINGS) ×1
APL SKNCLS STERI-STRIP NONHPOA (GAUZE/BANDAGES/DRESSINGS) ×1
BAG COUNTER SPONGE SURGICOUNT (BAG) ×1 IMPLANT
BAG SPNG CNTER NS LX DISP (BAG) ×2
BAND INSRT 18 STRL LF DISP RB (MISCELLANEOUS) ×2
BAND RUBBER #18 3X1/16 STRL (MISCELLANEOUS) ×2 IMPLANT
BENZOIN TINCTURE PRP APPL 2/3 (GAUZE/BANDAGES/DRESSINGS) ×1 IMPLANT
BUR CARBIDE MATCH 3.0 (BURR) ×1 IMPLANT
CANISTER SUCT 3000ML PPV (MISCELLANEOUS) ×1 IMPLANT
DERMABOND ADVANCED .7 DNX12 (GAUZE/BANDAGES/DRESSINGS) IMPLANT
DRAPE LAPAROTOMY 100X72X124 (DRAPES) ×1 IMPLANT
DRAPE MICROSCOPE SLANT 54X150 (MISCELLANEOUS) ×1 IMPLANT
DRAPE SURG 17X23 STRL (DRAPES) ×1 IMPLANT
DRSG OPSITE POSTOP 4X6 (GAUZE/BANDAGES/DRESSINGS) IMPLANT
DURAPREP 26ML APPLICATOR (WOUND CARE) ×1 IMPLANT
ELECT REM PT RETURN 9FT ADLT (ELECTROSURGICAL) ×1
ELECTRODE REM PT RTRN 9FT ADLT (ELECTROSURGICAL) ×1 IMPLANT
GAUZE 4X4 16PLY ~~LOC~~+RFID DBL (SPONGE) IMPLANT
GLOVE BIO SURGEON STRL SZ7 (GLOVE) IMPLANT
GLOVE BIO SURGEON STRL SZ8 (GLOVE) ×1 IMPLANT
GLOVE BIOGEL PI IND STRL 7.0 (GLOVE) IMPLANT
GOWN STRL REUS W/ TWL LRG LVL3 (GOWN DISPOSABLE) IMPLANT
GOWN STRL REUS W/ TWL XL LVL3 (GOWN DISPOSABLE) ×1 IMPLANT
GOWN STRL REUS W/TWL 2XL LVL3 (GOWN DISPOSABLE) IMPLANT
GOWN STRL REUS W/TWL LRG LVL3 (GOWN DISPOSABLE) ×2
GOWN STRL REUS W/TWL XL LVL3 (GOWN DISPOSABLE) ×3
HEMOSTAT POWDER KIT SURGIFOAM (HEMOSTASIS) ×1 IMPLANT
KIT BASIN OR (CUSTOM PROCEDURE TRAY) ×1 IMPLANT
KIT TURNOVER KIT B (KITS) ×1 IMPLANT
NDL HYPO 25X1 1.5 SAFETY (NEEDLE) ×1 IMPLANT
NDL SPNL 20GX3.5 QUINCKE YW (NEEDLE) IMPLANT
NEEDLE HYPO 25X1 1.5 SAFETY (NEEDLE) ×1 IMPLANT
NEEDLE SPNL 20GX3.5 QUINCKE YW (NEEDLE) ×1 IMPLANT
NS IRRIG 1000ML POUR BTL (IV SOLUTION) ×1 IMPLANT
PACK LAMINECTOMY NEURO (CUSTOM PROCEDURE TRAY) ×1 IMPLANT
PAD ARMBOARD 7.5X6 YLW CONV (MISCELLANEOUS) ×3 IMPLANT
SPONGE SURGIFOAM ABS GEL SZ50 (HEMOSTASIS) IMPLANT
STRIP CLOSURE SKIN 1/2X4 (GAUZE/BANDAGES/DRESSINGS) ×1 IMPLANT
SUT VIC AB 0 CT1 18XCR BRD8 (SUTURE) ×1 IMPLANT
SUT VIC AB 0 CT1 8-18 (SUTURE) ×1
SUT VIC AB 2-0 CP2 18 (SUTURE) ×1 IMPLANT
SUT VIC AB 3-0 SH 8-18 (SUTURE) ×1 IMPLANT
TOWEL GREEN STERILE (TOWEL DISPOSABLE) ×1 IMPLANT
TOWEL GREEN STERILE FF (TOWEL DISPOSABLE) ×1 IMPLANT
TRAY FOLEY W/BAG SLVR 16FR (SET/KITS/TRAYS/PACK) ×1
TRAY FOLEY W/BAG SLVR 16FR ST (SET/KITS/TRAYS/PACK) IMPLANT
WATER STERILE IRR 1000ML POUR (IV SOLUTION) ×1 IMPLANT

## 2022-11-02 NOTE — ED Provider Notes (Signed)
Patient signed out to me at 0700 by Dr. Tinnie Gens pending neurosurgery evaluation.  In short this is a 77 year old male presented to the emergency department with back pain.  You was initially seen at [facility] ED and transferred here for MRI.  MRI showed significant scoliosis with disc protrusion and severe canal stenosis.  He had urinary retention and Foley catheter was placed.  Neurosurgery was notified and plan to evaluate the patient at bedside for further recommendations.  Neurosurgery evaluated the patient and plan to admit him to the hospital for surgical management.   Kemper Durie, DO 11/02/22 1212

## 2022-11-02 NOTE — ED Provider Notes (Signed)
  Physical Exam  BP (!) 172/88 (BP Location: Right Arm)   Pulse 81   Temp 98.1 F (36.7 C) (Oral)   Resp 16   Ht '5\' 9"'$  (1.753 m)   Wt 83.9 kg   SpO2 97%   BMI 27.32 kg/m   Physical Exam Constitutional:      General: He is not in acute distress.    Appearance: Normal appearance.  HENT:     Head: Normocephalic and atraumatic.     Nose: No congestion or rhinorrhea.  Eyes:     General:        Right eye: No discharge.        Left eye: No discharge.     Extraocular Movements: Extraocular movements intact.     Pupils: Pupils are equal, round, and reactive to light.  Cardiovascular:     Rate and Rhythm: Normal rate and regular rhythm.     Heart sounds: No murmur heard. Pulmonary:     Effort: No respiratory distress.     Breath sounds: No wheezing or rales.  Abdominal:     General: There is distension.     Tenderness: There is no abdominal tenderness.  Musculoskeletal:        General: Tenderness present. Normal range of motion.     Cervical back: Normal range of motion.  Skin:    General: Skin is warm and dry.  Neurological:     General: No focal deficit present.     Mental Status: He is alert.     Cranial Nerves: No cranial nerve deficit.     Sensory: No sensory deficit.     Motor: No weakness.     Procedures  Ultrasound ED Abd  Date/Time: 11/02/2022 6:02 AM  Performed by: Teressa Lower, MD Authorized by: Teressa Lower, MD   Procedure details:    Indications: decreased urinary output     Bladder:  Visualized        Bladder findings:    Post-void residual volume:  800    ED Course / MDM    Medical Decision Making Amount and/or Complexity of Data Reviewed Labs: ordered. Radiology: ordered.  Risk OTC drugs. Prescription drug management.   Patient received in transfer.  Back pain sent for MRI.  PVR concerning for urinary retention with 800 cc of retained urine.  MRI is concerning with pronounced dextroconvex lumbar scoliosis with an L3-L4 large  complex disc extrusion resulting in severe spinal and right lateral recess stenosis.  I consulted neurosurgery and spoke with the APP on-call Meyran who will come to evaluate the patient and further recommendations are currently pending.  Please see provider signout for continuation of workup.  Of note, patient had significant hypokalemia while at med center [facility] but repeat BMP shows that this has resolved.         Teressa Lower, MD 11/02/22 [id number]

## 2022-11-02 NOTE — Anesthesia Procedure Notes (Signed)
Procedure Name: Intubation Date/Time: 11/02/2022 12:37 PM  Performed by: Betha Loa, CRNAPre-anesthesia Checklist: Patient identified, Emergency Drugs available, Suction available and Patient being monitored Patient Re-evaluated:Patient Re-evaluated prior to induction Oxygen Delivery Method: Circle System Utilized Preoxygenation: Pre-oxygenation with 100% oxygen Induction Type: IV induction Ventilation: Mask ventilation without difficulty Laryngoscope Size: Mac and 4 Grade View: Grade II Tube type: Oral Tube size: 7.5 mm Number of attempts: 1 Airway Equipment and Method: Stylet and Oral airway Placement Confirmation: ETT inserted through vocal cords under direct vision, positive ETCO2 and breath sounds checked- equal and bilateral Secured at: 22 cm Tube secured with: Tape Dental Injury: Teeth and Oropharynx as per pre-operative assessment

## 2022-11-02 NOTE — Op Note (Signed)
11/02/2022  2:29 PM  PATIENT:  [facility]  77 y.o. 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 Imaging showed severe spinal stenosis L3-4 with a large disc herniation in the midline. The patient tried conservative measures without relief. Pain was debilitating. Recommended decompressive laminectomy and microdiscectomy. Patient understood the risks, benefits, and alternatives and potential outcomes and wished to proceed.  PROCEDURE DETAILS: The patient was taken to the operating room and after induction of adequate generalized endotracheal anesthesia, the patient was rolled into the prone position on the Wilson frame and all pressure points were padded. The lumbar region was cleaned and then prepped with DuraPrep and draped in the usual sterile fashion. 5 cc of local anesthesia was injected and then a dorsal midline incision was made and carried down to the lumbo sacral fascia. The fascia was opened and the paraspinous musculature was taken down in a subperiosteal fashion to expose L3-4 bilaterally. Intraoperative x-ray confirmed my level, and then I used a combination of the high-speed drill and the Kerrison punches to perform a laminectomy, medial facetectomy, and foraminotomy at L3-4 bilaterally. The underlying yellow ligament was opened and removed in a piecemeal fashion to expose the underlying dura  and exiting nerve root on both sides. I undercut the lateral recess and dissected down until I was medial to and distal to the pedicle on both sides. The nerve root was well decompressed. We then gently retracted the nerve root medially with a retractor, coagulated the epidural venous vasculature, and found a large annular tear in the midline with a large fragment coming through the annular tear.  Microdissection was used and the large fragment was removed with a nerve hook and pituitary rongeur.  We then performed a thorough intradiscal discectomy with pituitary rongeurs and curettes, until I had a nice decompression of the nerve root and the midline. I then palpated with a coronary dilator along the nerve root and into the foramen to assure adequate decompression. I felt no more compression of the nerve root. I irrigated with saline solution containing bacitracin. Achieved hemostasis with bipolar cautery, lined the dura with Gelfoam, and then closed the fascia with 0 Vicryl. I closed the subcutaneous tissues with 2-0 Vicryl and the subcuticular tissues with 3-0 Vicryl. The skin was then closed with benzoin and Steri-Strips. The drapes were removed, a sterile dressing was applied.  My nurse practitioner was involved in the exposure, safe retraction of the neural elements, the disc work and the closure. the patient was awakened from general anesthesia and transferred to the recovery room in stable condition. At the end of the procedure all sponge, needle and instrument counts were correct.    PLAN OF CARE: Admit to inpatient   PATIENT DISPOSITION:  PACU - hemodynamically stable.   Delay start of Pharmacological VTE agent (>24hrs) due to surgical blood loss or risk of bleeding:  yes

## 2022-11-02 NOTE — Anesthesia Preprocedure Evaluation (Signed)
Anesthesia Evaluation  Patient identified by MRN, date of birth, ID band Patient awake    Reviewed: Allergy & Precautions, NPO status , Patient's Chart, lab work & pertinent test results  History of Anesthesia Complications Negative for: history of anesthetic complications  Airway Mallampati: I  TM Distance: >3 FB Neck ROM: Full    Dental  (+) Teeth Intact, Dental Advisory Given   Pulmonary neg shortness of breath, neg sleep apnea, neg COPD, neg recent URI, former smoker   breath sounds clear to auscultation       Cardiovascular hypertension, Pt. on medications  Rhythm:Regular     Neuro/Psych  Neuromuscular disease    GI/Hepatic negative GI ROS, Neg liver ROS,,,  Endo/Other  negative endocrine ROS    Renal/GU Lab Results      Component                Value               Date                      CREATININE               1.00                11/02/2022            ? Urinary retention     Musculoskeletal negative musculoskeletal ROS (+)    Abdominal   Peds  Hematology negative hematology ROS (+)   Anesthesia Other Findings   Reproductive/Obstetrics                             Anesthesia Physical Anesthesia Plan  ASA: 2  Anesthesia Plan: General   Post-op Pain Management: Ofirmev IV (intra-op)*   Induction: Intravenous  PONV Risk Score and Plan: 2 and Ondansetron and Dexamethasone  Airway Management Planned: Oral ETT  Additional Equipment: None  Intra-op Plan:   Post-operative Plan: Extubation in OR  Informed Consent: I have reviewed the patients History and Physical, chart, labs and discussed the procedure including the risks, benefits and alternatives for the proposed anesthesia with the patient or authorized representative who has indicated his/her understanding and acceptance.     Dental advisory given  Plan Discussed with:   Anesthesia Plan Comments:         Anesthesia Quick Evaluation

## 2022-11-02 NOTE — ED Notes (Signed)
Dr. Johnney Killian aware of K level of 2.6 and Calcium  of 5.7.

## 2022-11-02 NOTE — ED Notes (Signed)
PT report given to [place] @ [facility], verbalized complete understanding of pt plan of care and current condition denies questions at this time. [place] RN was also notified of K 2.6 and orders to treat with IV potassium.

## 2022-11-02 NOTE — ED Notes (Signed)
Post void bladder scan 816m

## 2022-11-02 NOTE — H&P (Signed)
Billy Sandoval is an 77 y.o. male.   HPI:  76 year old male presented to the ED last night with sciatica pain that started a couple days ago when he was lifting something heavy. He felt a pop in his back. He has since had pain in his back and right buttocks to his knee. This has gottena  little better and he now has some pain in his left leg. Denies any change in his bowel or bladder habits. He has had urinary issues that are chronic and has prostate problems as well.   History reviewed. No pertinent past medical history.  History reviewed. No pertinent surgical history.  No Known Allergies  Social History   Tobacco Use   Smoking status: Former    Types: Cigarettes   Smokeless tobacco: Never  Substance Use Topics   Alcohol use: Yes    Comment: Occ    History reviewed. No pertinent family history.   Review of Systems  Positive ROS: as above  All other systems have been reviewed and were otherwise negative with the exception of those mentioned in the HPI and as above.  Objective: Vital signs in last 24 hours: Temp:  [97.6 F (36.4 C)-98.3 F (36.8 C)] 97.7 F (36.5 C) (01/01 0531) Pulse Rate:  [62-98] 86 (01/01 0730) Resp:  [14-24] 16 (01/01 0730) BP: (130-191)/(75-102) 151/91 (01/01 0730) SpO2:  [92 %-100 %] 98 % (01/01 0730) Weight:  [83.9 kg] 83.9 kg (12/31 1405)  General Appearance: Alert, cooperative, no distress, appears stated age Head: Normocephalic, without obvious abnormality, atraumatic Eyes: PERRL, conjunctiva/corneas clear, EOM's intact, fundi benign, both eyes       Lungs: respirations unlabored Heart: Regular rate and rhythm+ and symmetric all extremities Skin: Skin color, texture, turgor normal, no rashes or lesions  NEUROLOGIC:   Mental status: A&O x4, no aphasia, good attention span, Memory and fund of knowledge Motor Exam - grossly normal, normal tone and bulk Sensory Exam - grossly normal Reflexes: symmetric, no pathologic reflexes, No  Hoffman's, No clonus Coordination - grossly normal Gait - grossly normal Balance - grossly normal Cranial Nerves: I: smell Not tested  II: visual acuity  OS: na    OD: na  II: visual fields Full to confrontation  II: pupils Equal, round, reactive to light  III,VII: ptosis None  III,IV,VI: extraocular muscles  Full ROM  V: mastication   V: facial light touch sensation    V,VII: corneal reflex    VII: facial muscle function - upper    VII: facial muscle function - lower   VIII: hearing   IX: soft palate elevation    IX,X: gag reflex   XI: trapezius strength    XI: sternocleidomastoid strength   XI: neck flexion strength    XII: tongue strength      Data Review Lab Results  Component Value Date   WBC 5.2 11/01/2022   HGB 14.6 11/01/2022   HCT 41.1 11/01/2022   MCV 89.3 11/01/2022   PLT 189 11/01/2022   Lab Results  Component Value Date   NA 136 11/02/2022   K 4.2 11/02/2022   CL 101 11/02/2022   CO2 23 11/02/2022   BUN 14 11/02/2022   CREATININE 1.00 11/02/2022   GLUCOSE 160 (H) 11/02/2022   No results found for: "INR", "PROTIME"  Radiology: MR Lumbar Spine Wo Contrast  Result Date: 11/02/2022 CLINICAL DATA:  77 year old male with left leg weakness and back pain after heavy lifting 4 days ago. EXAM: MRI LUMBAR  SPINE WITHOUT CONTRAST TECHNIQUE: Multiplanar, multisequence MR imaging of the lumbar spine was performed. No intravenous contrast was administered. COMPARISON:  Lumbar radiographs 11/01/2022. FINDINGS: Segmentation:  Normal on the comparison. Alignment: Moderate dextroconvex thoracolumbar scoliosis as seen on the radiographs. Apex of the curve is at L2-L3. Superimposed mild retrolisthesis of L3 on L4. See additional findings of that level below. Vertebrae: Widespread chronic degenerative endplate marrow signal changes. Background bone marrow signal remains normal. No marrow edema or evidence of acute osseous abnormality. Intact visible sacrum and SI joints. Conus  medullaris and cauda equina: Conus extends to the T12-L1 level. No lower spinal cord or conus signal abnormality. Paraspinal and other soft tissues: Visible abdominal viscera are negative for age. Partially visible moderate to severe urinary bladder distension. Paraspinal soft tissues remain within normal limits. Disc levels: T11-T12 and T12-L1 are normal for age. L1-L2: Mostly anterolateral disc bulging and endplate spurring. No spinal stenosis. L2-L3: Bulky left far lateral disc osteophyte complex. Mild facet hypertrophy but no spinal stenosis. L3-L4: Disc space loss, retrolisthesis, circumferential disc osteophyte complex and superimposed large complex posterior disc extrusion into the spinal canal. See series 2, image 8 and series 5, image 23). Superimposed epidural lipomatosis and mild to moderate facet and ligament flavum hypertrophy. Severe spinal stenosis. Extruded disc fragment estimated at 14 mm long axis. Severe right lateral recess stenosis (descending right L3 nerve level). And multifactorial moderate L3 foraminal stenosis also at this level. L4-L5: Mostly far lateral disc osteophyte complex. Moderate to severe facet and ligament flavum hypertrophy greater on the right with degenerative facet joint fluid. But no spinal stenosis. There is moderate to severe right L4 neural foraminal stenosis. L5-S1: Negative disc. Moderate to severe facet hypertrophy on the right degenerative facet joint fluid. No spinal stenosis. Moderate right L5 foraminal stenosis. IMPRESSION: 1. 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 Symptomatic level favored to be L3-L4 where a large, complex disc extrusion results in Severe spinal and right lateral recess stenosis. Up to moderate neural foraminal stenosis at that level. Query L3 or L4 radiculitis. 2. No other lumbar spinal stenosis. But there is moderate to severe right side neural foraminal stenosis at both L4-L5 and L5-S1. 3. Partially visible  urinary bladder distension, suspicious for urinary retention. Electronically Signed   By: Genevie Ann M.D.   On: 11/02/2022 04:04   DG Lumbar Spine Complete  Result Date: 11/01/2022 CLINICAL DATA:  Progressive back pain over the last 2 days. Symptoms are worse right than left. EXAM: LUMBAR SPINE - COMPLETE 4 VIEW COMPARISON:  None Available. FINDINGS: Five non rib-bearing lumbar type vertebral bodies are present. No significant listhesis is present. Straightening of the normal cervical lordosis is present. Vertebral body heights are maintained. Moderate rightward curvature is centered at L2. Chronic asymmetric left-sided degenerative changes are present at L1-2 and L2-3. Right lateral listhesis of L3 on L4 measures up to 9 mm. Asymmetric right sided endplate and probable foraminal changes are present at L4-5 and L5-S1. Atherosclerotic changes are present in the aorta without evidence for aneurysm. IMPRESSION: 1. No acute abnormality. 2. Moderate rightward curvature centered at L2. 3. Asymmetric right-sided endplate changes and probable foraminal narrowing at L4-5 and L5-S1. 4. Right lateral listhesis of L3 on L4. Electronically Signed   By: San Morelle M.D.   On: 11/01/2022 14:41    Assessment/Plan: 77 year old male presented to the ED with sciatica pain. His PVR was actually 800 but denied any feeling of not emptying his bladder. 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 His PVR may or may not be related to the HNP but difficult to say given his prostate history. We will plan to admit him in the hospital and place a foley catheter. Likely a microdiskectomy tomorrow or Wednesday. Discussed the risks and benefits associated with the surgery. We will follow up with him again a little bit.   Ocie Cornfield Velia Pamer 11/02/2022 8:03 AM

## 2022-11-02 NOTE — Anesthesia Postprocedure Evaluation (Signed)
Anesthesia Post Note  Patient: Billy Sandoval  Procedure(s) Performed: LUMBAR THREE-FOUR LUMBAR LAMINECTOMY/DECOMPRESSION MICRODISCECTOMY (Bilateral: Back)     Patient location during evaluation: PACU Anesthesia Type: General Level of consciousness: awake and alert Pain management: pain level controlled Vital Signs Assessment: post-procedure vital signs reviewed and stable Respiratory status: spontaneous breathing, nonlabored ventilation, respiratory function stable and patient connected to nasal cannula oxygen Cardiovascular status: blood pressure returned to baseline and stable Postop Assessment: no apparent nausea or vomiting Anesthetic complications: no  No notable events documented.  Last Vitals:  Vitals:   11/02/22 1607 11/02/22 2100  BP: (!) 151/77 136/77  Pulse: 68 87  Resp: 16 18  Temp: 36.7 C 36.6 C  SpO2: 99% 97%    Last Pain:  Vitals:   11/02/22 2100  TempSrc: Oral  PainSc:                  [name]

## 2022-11-02 NOTE — Transfer of Care (Signed)
Immediate Anesthesia Transfer of Care Note  Patient: Ashford Clouse  Procedure(s) Performed: LUMBAR THREE-FOUR LUMBAR LAMINECTOMY/DECOMPRESSION MICRODISCECTOMY (Bilateral: Back)  Patient Location: PACU  Anesthesia Type:General  Level of Consciousness: patient cooperative and responds to stimulation  Airway & Oxygen Therapy: Patient Spontanous Breathing  Post-op Assessment: Report given to RN, Post -op Vital signs reviewed and stable, and Patient moving all extremities X 4  Post vital signs: Reviewed and stable  Last Vitals:  Vitals Value Taken Time  BP 135/67 11/02/22 1435  Temp 36.3 C 11/02/22 1435  Pulse 75 11/02/22 1440  Resp 15 11/02/22 1440  SpO2 95 % 11/02/22 1440  Vitals shown include unvalidated device data.  Last Pain:  Vitals:   11/02/22 1132  TempSrc:   PainSc: 2       Patients Stated Pain Goal: 2 (27/25/36 6440)  Complications: No notable events documented.

## 2022-11-02 NOTE — Progress Notes (Signed)
Patient ID: Billy Sandoval, male   DOB: [birth year], 77 y.o.   MRN: [id number] I evaluated patient with Joelene Millin in the emergency department.  He has a large disc herniation at L3-4 with spondylosis and severe stenosis with back pain and radicular pain and urinary retention.  Does not complain of numbness or tingling in the legs.  May have some generalized weakness of the left lower extremity.  We have recommended surgical decompression in the form of bilateral L3-4 hemilaminectomy and medial facetectomy with probable right-sided discectomy.  He understands the risk of the surgery include but are not limited to bleeding, infection, nerve injury, numbness, weakness, CSF leak, loss of bowel bladder or sexual function, iatrogenic instability, lack of relief of symptoms, worsening symptoms, vascular injury, need for further surgery, and anesthesia risk including DVT pneumonia MI and death.  He agrees to proceed we will do so today when a room is available

## 2022-11-02 NOTE — ED Notes (Signed)
Patient arrived from [facility] as a transfer patient. Patient placed in room, assumed care at this time.

## 2022-11-03 ENCOUNTER — Encounter (HOSPITAL_COMMUNITY): Payer: Self-pay | Admitting: Neurological Surgery

## 2022-11-03 MED ORDER — HYDROCODONE-ACETAMINOPHEN 5-325 MG PO TABS: 1.0000 | ORAL_TABLET | ORAL | 0 refills | Status: AC | PRN

## 2022-11-03 MED ORDER — METHOCARBAMOL 750 MG PO TABS: 750.0000 mg | ORAL_TABLET | Freq: Four times a day (QID) | ORAL | 0 refills | Status: AC

## 2022-11-03 NOTE — Plan of Care (Signed)

## 2022-11-03 NOTE — Progress Notes (Signed)
  Transition of Care [facility]) Screening Note   Patient Details  Name: Billy Sandoval Date of Birth: 1946-10-09   Transition of Care [facility]) CM/SW Contact:    Pollie Friar, RN Phone Number: 11/03/2022, 2:38 PM   No f/u per PT. Pt is from home with spouse. Transition of Care Department [facility]) has reviewed patient. We will continue to monitor patient advancement through interdisciplinary progression rounds. If new patient transition needs arise, please place a TOC consult.

## 2022-11-03 NOTE — Evaluation (Signed)
Physical Therapy Evaluation Patient Details Name: Billy Sandoval: [id number] DOB: 1946-05-23 Today's Date: 11/03/2022  History of Present Illness  77 year old male presented to the ED 11/02/22 with sciatica pain that started a couple days ago when he was lifting something heavy. 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 1/1 Decompressive lumbar laminectomy, medial facetectomy foraminotomies L3-4 followed by microdiscectomy  Clinical Impression   Pt admitted secondary to problem above with deficits below. 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 Pt currently requires up to min assist for mobility due to slightly unsteady on his feet--although he did better without RW than with RW. Patient required vc's throughout mobility to adhere to back precautions. Patient can benefit from another PT session prior to discharge (and OT evaluation for ADL training while adhering to back precautions).  Anticipate patient will benefit from PT to address problems listed below.Will continue to follow acutely to maximize functional mobility independence and safety.          Recommendations for follow up therapy are one component of a multi-disciplinary discharge planning process, led by the attending physician.  Recommendations may be updated based on patient status, additional functional criteria and insurance authorization.  Follow Up Recommendations No PT follow up      Assistance Recommended at Discharge PRN  Patient can return home with the following  A little help with walking and/or transfers;A little help with bathing/dressing/bathroom;Assistance with cooking/housework;Help with stairs or ramp for entrance    Equipment Recommendations None recommended by PT  Recommendations for Other Services  OT consult    Functional Status Assessment Patient has had a recent decline in their functional  status and demonstrates the ability to make significant improvements in function in a reasonable and predictable amount of time.     Precautions / Restrictions Precautions Precautions: Back Precaution Booklet Issued: Yes (comment) Precaution Comments: pt instructed in precautions and required cues to maintain while moblizing; only able to state 1 of 3 at end of sessin Required Braces or Orthoses:  (no brace)      Mobility  Bed Mobility Overal bed mobility: Needs Assistance Bed Mobility: Rolling, Sidelying to Sit, Sit to Sidelying Rolling: Supervision Sidelying to sit: Supervision     Sit to sidelying: Supervision General bed mobility comments: vc for technique with each movement to maintain precautions    Transfers Overall transfer level: Needs assistance Equipment used: Rolling walker (2 wheels), None Transfers: Sit to/from Stand Sit to Stand: Min guard           General transfer comment: vc for pushing off the furniture or his knees, not to pull up on RW    Ambulation/Gait Ambulation/Gait assistance: Min guard Gait Distance (Feet): 140 Feet (with RW; 15 no device) Assistive device: Rolling walker (2 wheels), None Gait Pattern/deviations: Step-through pattern, Decreased stride length   Gait velocity interpretation: 1.31 - 2.62 ft/sec, indicative of limited community ambulator   General Gait Details: step length improved without RW as velocity also improved  Stairs            Wheelchair Mobility    Modified Rankin (Stroke Patients Only)       Balance Overall balance assessment: Mild deficits observed, not formally tested (states he feels his inner ear is a little off)  Pertinent Vitals/Pain Pain Assessment Pain Assessment: 0-10 Pain Score: 2  Pain Location: back Pain Descriptors / Indicators: Operative site guarding Pain Intervention(s): Limited activity within patient's tolerance,  Monitored during session    [place] expects to be discharged to:: Private residence Living Arrangements: Spouse/significant other Available Help at Discharge: Family Type of Home: House Home Access: Stairs to enter   Technical brewer of Steps: 1   Home Layout: Able to live on main level with bedroom/bathroom;Two level Home Equipment: Conservation officer, nature (2 wheels);Cane - quad;Cane - single point;Shower seat - built in;Grab bars - tub/shower;Hand held shower head      Prior Function Prior Level of Function : Independent/Modified Independent                     Hand Dominance        Extremity/Trunk Assessment   Upper Extremity Assessment Upper Extremity Assessment: Overall WFL for tasks assessed    Lower Extremity Assessment Lower Extremity Assessment: Overall WFL for tasks assessed    Cervical / Trunk Assessment Cervical / Trunk Assessment: Normal  Communication   Communication: No difficulties  Cognition Arousal/Alertness: Awake/alert Behavior During Therapy: WFL for tasks assessed/performed Overall Cognitive Status: Within Functional Limits for tasks assessed                                          General Comments General comments (skin integrity, edema, etc.): Wife present for all eduation    Exercises     Assessment/Plan    PT Assessment Patient needs continued PT services  PT Problem List Decreased balance;Decreased mobility;Decreased knowledge of use of DME;Decreased knowledge of precautions;Pain       PT Treatment Interventions DME instruction;Gait training;Stair training;Functional mobility training;Therapeutic activities;Balance training;Patient/family education    PT Goals (Current goals can be found in the Care Plan section)  Acute Rehab PT Goals 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 PT Goal Formulation: With patient Time For Goal Achievement: 11/17/22 Potential to Achieve Goals: Good     Frequency Min 5X/week     Co-evaluation               AM-PAC PT "6 Clicks" Mobility  Outcome Measure Help needed turning from your back to your side while in a flat bed without using bedrails?: A Little Help needed moving from lying on your back to sitting on the side of a flat bed without using bedrails?: A Little Help needed moving to and from a bed to a chair (including a wheelchair)?: A Little Help needed standing up from a chair using your arms (e.g., wheelchair or bedside chair)?: A Little Help needed to walk in hospital room?: A Little Help needed climbing 3-5 steps with a railing? : A Little 6 Click Score: 18    End of Session Equipment Utilized During Treatment: Gait belt Activity Tolerance: Patient tolerated treatment well Patient left: in bed;with call bell/phone within reach;with bed alarm set;with family/visitor present;with nursing/sitter in room Nurse Communication: Mobility status;Other (comment) (a little off balance) PT Visit Diagnosis: Unsteadiness on feet (R26.81);Pain Pain - Right/Left:  (center) Pain - part of body:  (back)    Time: 7209-4709 PT Time Calculation (min) (ACUTE ONLY): 38 min   Charges:   PT Evaluation $PT Eval Low Complexity: 1 Low PT Treatments $Gait Training: 23-37 mins         Arby Barrette, PT  Acute Rehabilitation Services  Office [phone]   Rexanne Mano 11/03/2022, 10:41 AM

## 2022-11-03 NOTE — Progress Notes (Signed)
Subjective: Patient reports doing well, pain in his legs is gone  Objective: Vital signs in last 24 hours: Temp:  [97.4 F (36.3 C)-98.5 F (36.9 C)] 98.4 F (36.9 C) (01/02 0738) Pulse Rate:  [63-106] 63 (01/02 0738) Resp:  [13-18] 16 (01/02 0738) BP: (106-153)/(47-86) 111/63 (01/02 0738) SpO2:  [93 %-99 %] 94 % (01/02 0738)  Intake/Output from previous day: 01/01 0701 - 01/02 0700 In: 1935 [P.O.:360; I.V.:1475] Out: 2450 [Urine:2400; Blood:50] Intake/Output this shift: No intake/output data recorded.  Neurologic: Grossly normal  Lab Results: Lab Results  Component Value Date   WBC 5.2 11/01/2022   HGB 14.6 11/01/2022   HCT 41.1 11/01/2022   MCV 89.3 11/01/2022   PLT 189 11/01/2022   No results found for: "INR", "PROTIME" BMET Lab Results  Component Value Date   NA 136 11/02/2022   K 4.2 11/02/2022   CL 101 11/02/2022   CO2 23 11/02/2022   GLUCOSE 160 (H) 11/02/2022   BUN 14 11/02/2022   CREATININE 1.00 11/02/2022   CALCIUM 8.8 (L) 11/02/2022    Studies/Results: DG Lumbar Spine 2-3 Views  Result Date: 11/02/2022 CLINICAL DATA:  Elective surgery. EXAM: LUMBAR SPINE - 2-3 VIEW COMPARISON:  Lumbar spine radiographs 11/01/2022, MRI lumbar spine 11/02/2022 FINDINGS: Two lateral images of the lumbar spine. On the initial image, there is a needle from posterior approach overlying the L3 spinous process and pointing towards the L3-4 disc space. On the second image, there is surgical instrumentation overlying the space in between the L3 and L4 spinous processes and directed towards the L3-4 disc space. IMPRESSION: Intraoperative localization as above. Electronically Signed   By: Yvonne Kendall M.D.   On: 11/02/2022 15:19   MR Lumbar Spine Wo Contrast  Result Date: 11/02/2022 CLINICAL DATA:  77 year old male with left leg weakness and back pain after heavy lifting 4 days ago. EXAM: MRI LUMBAR SPINE WITHOUT CONTRAST TECHNIQUE: Multiplanar, multisequence MR imaging of the  lumbar spine was performed. No intravenous contrast was administered. COMPARISON:  Lumbar radiographs 11/01/2022. FINDINGS: Segmentation:  Normal on the comparison. Alignment: Moderate dextroconvex thoracolumbar scoliosis as seen on the radiographs. Apex of the curve is at L2-L3. Superimposed mild retrolisthesis of L3 on L4. See additional findings of that level below. Vertebrae: Widespread chronic degenerative endplate marrow signal changes. Background bone marrow signal remains normal. No marrow edema or evidence of acute osseous abnormality. Intact visible sacrum and SI joints. Conus medullaris and cauda equina: Conus extends to the T12-L1 level. No lower spinal cord or conus signal abnormality. Paraspinal and other soft tissues: Visible abdominal viscera are negative for age. Partially visible moderate to severe urinary bladder distension. Paraspinal soft tissues remain within normal limits. Disc levels: T11-T12 and T12-L1 are normal for age. L1-L2: Mostly anterolateral disc bulging and endplate spurring. No spinal stenosis. L2-L3: Bulky left far lateral disc osteophyte complex. Mild facet hypertrophy but no spinal stenosis. L3-L4: Disc space loss, retrolisthesis, circumferential disc osteophyte complex and superimposed large complex posterior disc extrusion into the spinal canal. See series 2, image 8 and series 5, image 23). Superimposed epidural lipomatosis and mild to moderate facet and ligament flavum hypertrophy. Severe spinal stenosis. Extruded disc fragment estimated at 14 mm long axis. Severe right lateral recess stenosis (descending right L3 nerve level). And multifactorial moderate L3 foraminal stenosis also at this level. L4-L5: Mostly far lateral disc osteophyte complex. Moderate to severe facet and ligament flavum hypertrophy greater on the right with degenerative facet joint fluid. But no spinal stenosis. There is  moderate to severe right L4 neural foraminal stenosis. L5-S1: Negative disc.  Moderate to severe facet hypertrophy on the right degenerative facet joint fluid. No spinal stenosis. Moderate right L5 foraminal stenosis. IMPRESSION: 1. 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 Symptomatic level favored to be L3-L4 where a large, complex disc extrusion results in Severe spinal and right lateral recess stenosis. Up to moderate neural foraminal stenosis at that level. Query L3 or L4 radiculitis. 2. No other lumbar spinal stenosis. But there is moderate to severe right side neural foraminal stenosis at both L4-L5 and L5-S1. 3. Partially visible urinary bladder distension, suspicious for urinary retention. Electronically Signed   By: Genevie Ann M.D.   On: 11/02/2022 04:04   DG Lumbar Spine Complete  Result Date: 11/01/2022 CLINICAL DATA:  Progressive back pain over the last 2 days. Symptoms are worse right than left. EXAM: LUMBAR SPINE - COMPLETE 4 VIEW COMPARISON:  None Available. FINDINGS: Five non rib-bearing lumbar type vertebral bodies are present. No significant listhesis is present. Straightening of the normal cervical lordosis is present. Vertebral body heights are maintained. Moderate rightward curvature is centered at L2. Chronic asymmetric left-sided degenerative changes are present at L1-2 and L2-3. Right lateral listhesis of L3 on L4 measures up to 9 mm. Asymmetric right sided endplate and probable foraminal changes are present at L4-5 and L5-S1. Atherosclerotic changes are present in the aorta without evidence for aneurysm. IMPRESSION: 1. No acute abnormality. 2. Moderate rightward curvature centered at L2. 3. Asymmetric right-sided endplate changes and probable foraminal narrowing at L4-5 and L5-S1. 4. Right lateral listhesis of L3 on L4. Electronically Signed   By: San Morelle M.D.   On: 11/01/2022 14:41    Assessment/Plan: Postop day 1 microdiskectomy. Foley removed, bladder scan every 6 hours if not voiding. Therapy today.    LOS: 1 day     Billy Sandoval 11/03/2022, 7:50 AM

## 2022-11-03 NOTE — Discharge Summary (Signed)
Physician Discharge Summary  Patient ID: Billy Sandoval MRN: [id number] DOB/AGE: 1946/09/23 77 y.o.  Admit date: 11/01/2022 Discharge date: 11/03/2022  Admission Diagnoses: cauda equina syndrome    Discharge Diagnoses: same   Discharged Condition: good  Hospital Course: The patient was admitted on 11/01/2022 and taken to the operating room where the patient underwent microdiskectomy L3-4. The patient tolerated the procedure well and was taken to the recovery room and then to the floor in stable condition. The hospital course was routine. There were no complications. The wound remained clean dry and intact. Pt had appropriate back soreness. No complaints of leg pain or new N/T/W. The patient remained afebrile with stable vital signs, and tolerated a regular diet. The patient continued to increase activities, and pain was well controlled with oral pain medications.   Consults: None  Significant Diagnostic Studies:  Results for orders placed or performed during the hospital encounter of 11/01/22  Urinalysis, Routine w reflex microscopic Urine, Clean Catch  Result Value Ref Range   Color, Urine YELLOW YELLOW   APPearance CLEAR CLEAR   Specific Gravity, Urine 1.025 1.005 - 1.030   pH 6.5 5.0 - 8.0   Glucose, UA NEGATIVE NEGATIVE mg/dL   Hgb urine dipstick NEGATIVE NEGATIVE   Bilirubin Urine NEGATIVE NEGATIVE   Ketones, ur NEGATIVE NEGATIVE mg/dL   Protein, ur NEGATIVE NEGATIVE mg/dL   Nitrite NEGATIVE NEGATIVE   Leukocytes,Ua NEGATIVE NEGATIVE  Basic metabolic panel  Result Value Ref Range   Sodium 142 135 - 145 mmol/L   Potassium 2.6 (LL) 3.5 - 5.1 mmol/L   Chloride 118 (H) 98 - 111 mmol/L   CO2 18 (L) 22 - 32 mmol/L   Glucose, Bld 96 70 - 99 mg/dL   BUN 12 8 - 23 mg/dL   Creatinine, Ser 0.53 (L) 0.61 - 1.24 mg/dL   Calcium 5.7 (LL) 8.9 - 10.3 mg/dL   GFR, Estimated >60 >60 mL/min   Anion gap 6 5 - 15  CBC with Differential/Platelet  Result Value Ref Range   WBC 5.2 4.0 -  10.5 K/uL   RBC 4.60 4.22 - 5.81 MIL/uL   Hemoglobin 14.6 13.0 - 17.0 g/dL   HCT 41.1 39.0 - 52.0 %   MCV 89.3 80.0 - 100.0 fL   MCH 31.7 26.0 - 34.0 pg   MCHC 35.5 30.0 - 36.0 g/dL   RDW 12.6 11.5 - 15.5 %   Platelets 189 150 - 400 K/uL   nRBC 0.0 0.0 - 0.2 %   Neutrophils Relative % 90 %   Neutro Abs 4.6 1.7 - 7.7 K/uL   Lymphocytes Relative 9 %   Lymphs Abs 0.5 (L) 0.7 - 4.0 K/uL   Monocytes Relative 1 %   Monocytes Absolute 0.1 0.1 - 1.0 K/uL   Eosinophils Relative 0 %   Eosinophils Absolute 0.0 0.0 - 0.5 K/uL   Basophils Relative 0 %   Basophils Absolute 0.0 0.0 - 0.1 K/uL   Immature Granulocytes 0 %   Abs Immature Granulocytes 0.02 0.00 - 0.07 K/uL  Magnesium  Result Value Ref Range   Magnesium 1.3 (L) 1.7 - 2.4 mg/dL  Phosphorus  Result Value Ref Range   Phosphorus 1.3 (L) 2.5 - 4.6 mg/dL  Basic metabolic panel  Result Value Ref Range   Sodium 136 135 - 145 mmol/L   Potassium 4.2 3.5 - 5.1 mmol/L   Chloride 101 98 - 111 mmol/L   CO2 23 22 - 32 mmol/L   Glucose, Bld 160 (H)  70 - 99 mg/dL   BUN 14 8 - 23 mg/dL   Creatinine, Ser 1.00 0.61 - 1.24 mg/dL   Calcium 8.8 (L) 8.9 - 10.3 mg/dL   GFR, Estimated >60 >60 mL/min   Anion gap 12 5 - 15    DG Lumbar Spine 2-3 Views  Result Date: 11/02/2022 CLINICAL DATA:  Elective surgery. EXAM: LUMBAR SPINE - 2-3 VIEW COMPARISON:  Lumbar spine radiographs 11/01/2022, MRI lumbar spine 11/02/2022 FINDINGS: Two lateral images of the lumbar spine. On the initial image, there is a needle from posterior approach overlying the L3 spinous process and pointing towards the L3-4 disc space. On the second image, there is surgical instrumentation overlying the space in between the L3 and L4 spinous processes and directed towards the L3-4 disc space. IMPRESSION: Intraoperative localization as above. Electronically Signed   By: Yvonne Kendall M.D.   On: 11/02/2022 15:19   MR Lumbar Spine Wo Contrast  Result Date: 11/02/2022 CLINICAL DATA:   77 year old male with left leg weakness and back pain after heavy lifting 4 days ago. EXAM: MRI LUMBAR SPINE WITHOUT CONTRAST TECHNIQUE: Multiplanar, multisequence MR imaging of the lumbar spine was performed. No intravenous contrast was administered. COMPARISON:  Lumbar radiographs 11/01/2022. FINDINGS: Segmentation:  Normal on the comparison. Alignment: Moderate dextroconvex thoracolumbar scoliosis as seen on the radiographs. Apex of the curve is at L2-L3. Superimposed mild retrolisthesis of L3 on L4. See additional findings of that level below. Vertebrae: Widespread chronic degenerative endplate marrow signal changes. Background bone marrow signal remains normal. No marrow edema or evidence of acute osseous abnormality. Intact visible sacrum and SI joints. Conus medullaris and cauda equina: Conus extends to the T12-L1 level. No lower spinal cord or conus signal abnormality. Paraspinal and other soft tissues: Visible abdominal viscera are negative for age. Partially visible moderate to severe urinary bladder distension. Paraspinal soft tissues remain within normal limits. Disc levels: T11-T12 and T12-L1 are normal for age. L1-L2: Mostly anterolateral disc bulging and endplate spurring. No spinal stenosis. L2-L3: Bulky left far lateral disc osteophyte complex. Mild facet hypertrophy but no spinal stenosis. L3-L4: Disc space loss, retrolisthesis, circumferential disc osteophyte complex and superimposed large complex posterior disc extrusion into the spinal canal. See series 2, image 8 and series 5, image 23). Superimposed epidural lipomatosis and mild to moderate facet and ligament flavum hypertrophy. Severe spinal stenosis. Extruded disc fragment estimated at 14 mm long axis. Severe right lateral recess stenosis (descending right L3 nerve level). And multifactorial moderate L3 foraminal stenosis also at this level. L4-L5: Mostly far lateral disc osteophyte complex. Moderate to severe facet and ligament flavum  hypertrophy greater on the right with degenerative facet joint fluid. But no spinal stenosis. There is moderate to severe right L4 neural foraminal stenosis. L5-S1: Negative disc. Moderate to severe facet hypertrophy on the right degenerative facet joint fluid. No spinal stenosis. Moderate right L5 foraminal stenosis. IMPRESSION: 1. 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 Symptomatic level favored to be L3-L4 where a large, complex disc extrusion results in Severe spinal and right lateral recess stenosis. Up to moderate neural foraminal stenosis at that level. Query L3 or L4 radiculitis. 2. No other lumbar spinal stenosis. But there is moderate to severe right side neural foraminal stenosis at both L4-L5 and L5-S1. 3. Partially visible urinary bladder distension, suspicious for urinary retention. Electronically Signed   By: Genevie Ann M.D.   On: 11/02/2022 04:04   DG Lumbar Spine Complete  Result Date: 11/01/2022 CLINICAL DATA:  Progressive back  pain over the last 2 days. Symptoms are worse right than left. EXAM: LUMBAR SPINE - COMPLETE 4 VIEW COMPARISON:  None Available. FINDINGS: Five non rib-bearing lumbar type vertebral bodies are present. No significant listhesis is present. Straightening of the normal cervical lordosis is present. Vertebral body heights are maintained. Moderate rightward curvature is centered at L2. Chronic asymmetric left-sided degenerative changes are present at L1-2 and L2-3. Right lateral listhesis of L3 on L4 measures up to 9 mm. Asymmetric right sided endplate and probable foraminal changes are present at L4-5 and L5-S1. Atherosclerotic changes are present in the aorta without evidence for aneurysm. IMPRESSION: 1. No acute abnormality. 2. Moderate rightward curvature centered at L2. 3. Asymmetric right-sided endplate changes and probable foraminal narrowing at L4-5 and L5-S1. 4. Right lateral listhesis of L3 on L4. Electronically Signed   By: San Morelle M.D.   On: 11/01/2022 14:41    Antibiotics:  Anti-infectives (From admission, onward)    Start     Dose/Rate Route Frequency Ordered Stop   11/02/22 2000  ceFAZolin (ANCEF) IVPB 2g/100 mL premix        2 g 200 mL/hr over 30 Minutes Intravenous Every 8 hours 11/02/22 1608 11/03/22 0510       Discharge Exam: Blood pressure 134/64, pulse 82, temperature 98 F (36.7 C), temperature source Oral, resp. rate 14, height '5\' 9"'$  (1.753 m), weight 83.9 kg, SpO2 96 %. Neurologic: Grossly normal Ambulating and voiding well incision cdi   Discharge Medications:   Allergies as of 11/03/2022       Reactions   Penicillins Rash   As a child had a rash Pt tolerated Ancef 2g IV on 11/02/2022        Medication List     TAKE these medications    allopurinol 100 MG tablet Commonly known as: ZYLOPRIM Take 100 mg by mouth daily.   amLODipine 5 MG tablet Commonly known as: NORVASC Take 5 mg by mouth daily.   atorvastatin 20 MG tablet Commonly known as: LIPITOR Take 20 mg by mouth daily.   CALCIUM PO Take 1 tablet by mouth daily.   CO Q 10 PO Take 1 capsule by mouth daily.   Fish Oil 1000 MG Caps Take 1,000 mg by mouth daily.   HYDROcodone-acetaminophen 5-325 MG tablet Commonly known as: NORCO/VICODIN Take 1 tablet by mouth every 4 (four) hours as needed for moderate pain.   losartan 50 MG tablet Commonly known as: COZAAR Take 75 mg by mouth daily.   methocarbamol 750 MG tablet Commonly known as: Robaxin-750 Take 1 tablet (750 mg total) by mouth 4 (four) times daily.   multivitamin tablet Take 1 tablet by mouth daily. [place] Men's over 50 multivitamin   vitamin C 1000 MG tablet Take 1,000 mg by mouth daily.   Vitamin D3 50 MCG (2000 UT) Tabs Take 2,000 Units by mouth daily.        Disposition: home   Final Dx: microdiskectomy L3-4  Discharge Instructions      Remove dressing in 72 hours   Complete by: As directed    Call MD for:  difficulty breathing,  headache or visual disturbances   Complete by: As directed    Call MD for:  hives   Complete by: As directed    Call MD for:  persistant nausea and vomiting   Complete by: As directed    Call MD for:  redness, tenderness, or signs of infection (pain, swelling, redness, odor or green/yellow discharge around  incision site)   Complete by: As directed    Call MD for:  severe uncontrolled pain   Complete by: As directed    Call MD for:  temperature >100.4   Complete by: As directed    Diet - low sodium heart healthy   Complete by: As directed    Driving Restrictions   Complete by: As directed    No driving for 2 weeks, no riding in the car for 1 week   Increase activity slowly   Complete by: As directed    Lifting restrictions   Complete by: As directed    No lifting more than 8 lbs          Signed: Ocie Cornfield Shaniah Baltes 11/03/2022, 4:02 PM

## 2022-11-04 MED FILL — Thrombin For Soln 5000 Unit: CUTANEOUS | Qty: 2 | Status: AC
